# Patient Record
Sex: Female | Born: 1945 | Race: White | Hispanic: No | Marital: Married | Smoking: Never smoker
Health system: Southern US, Community
[De-identification: ages and names within clinical notes are randomized; demographics above are authoritative.]

## PROBLEM LIST (undated history)

## (undated) DIAGNOSIS — F0390 Unspecified dementia without behavioral disturbance: Secondary | ICD-10-CM

## (undated) DIAGNOSIS — E079 Disorder of thyroid, unspecified: Secondary | ICD-10-CM

## (undated) DIAGNOSIS — F329 Major depressive disorder, single episode, unspecified: Secondary | ICD-10-CM

## (undated) DIAGNOSIS — F32A Depression, unspecified: Secondary | ICD-10-CM

## (undated) DIAGNOSIS — I10 Essential (primary) hypertension: Secondary | ICD-10-CM

## (undated) DIAGNOSIS — E119 Type 2 diabetes mellitus without complications: Secondary | ICD-10-CM

---

## 1997-11-04 ENCOUNTER — Other Ambulatory Visit: Admission: RE | Admit: 1997-11-04 | Discharge: 1997-11-04 | Payer: Self-pay | Admitting: *Deleted

## 1998-12-21 ENCOUNTER — Other Ambulatory Visit: Admission: RE | Admit: 1998-12-21 | Discharge: 1998-12-21 | Payer: Self-pay | Admitting: *Deleted

## 2001-01-18 ENCOUNTER — Emergency Department (HOSPITAL_COMMUNITY): Admission: EM | Admit: 2001-01-18 | Discharge: 2001-01-18 | Payer: Self-pay | Admitting: Emergency Medicine

## 2001-01-19 ENCOUNTER — Emergency Department (HOSPITAL_COMMUNITY): Admission: EM | Admit: 2001-01-19 | Discharge: 2001-01-19 | Payer: Self-pay

## 2001-01-21 ENCOUNTER — Ambulatory Visit (HOSPITAL_COMMUNITY): Admission: RE | Admit: 2001-01-21 | Discharge: 2001-01-21 | Payer: Self-pay | Admitting: Emergency Medicine

## 2001-01-21 ENCOUNTER — Encounter: Payer: Self-pay | Admitting: Emergency Medicine

## 2001-01-28 ENCOUNTER — Observation Stay (HOSPITAL_COMMUNITY): Admission: RE | Admit: 2001-01-28 | Discharge: 2001-01-29 | Payer: Self-pay | Admitting: Neurosurgery

## 2001-05-05 ENCOUNTER — Encounter: Payer: Self-pay | Admitting: Vascular Surgery

## 2001-05-06 ENCOUNTER — Inpatient Hospital Stay (HOSPITAL_COMMUNITY): Admission: RE | Admit: 2001-05-06 | Discharge: 2001-05-07 | Payer: Self-pay | Admitting: Vascular Surgery

## 2015-05-03 DIAGNOSIS — D510 Vitamin B12 deficiency anemia due to intrinsic factor deficiency: Secondary | ICD-10-CM | POA: Diagnosis not present

## 2015-05-03 DIAGNOSIS — I739 Peripheral vascular disease, unspecified: Secondary | ICD-10-CM | POA: Diagnosis not present

## 2015-05-03 DIAGNOSIS — J449 Chronic obstructive pulmonary disease, unspecified: Secondary | ICD-10-CM | POA: Diagnosis not present

## 2015-05-03 DIAGNOSIS — G8929 Other chronic pain: Secondary | ICD-10-CM | POA: Diagnosis not present

## 2015-05-03 DIAGNOSIS — M545 Low back pain: Secondary | ICD-10-CM | POA: Diagnosis not present

## 2015-05-03 DIAGNOSIS — E785 Hyperlipidemia, unspecified: Secondary | ICD-10-CM | POA: Diagnosis not present

## 2015-05-03 DIAGNOSIS — M791 Myalgia: Secondary | ICD-10-CM | POA: Diagnosis not present

## 2015-05-03 DIAGNOSIS — F329 Major depressive disorder, single episode, unspecified: Secondary | ICD-10-CM | POA: Diagnosis not present

## 2015-05-03 DIAGNOSIS — R5383 Other fatigue: Secondary | ICD-10-CM | POA: Diagnosis not present

## 2015-05-03 DIAGNOSIS — I2510 Atherosclerotic heart disease of native coronary artery without angina pectoris: Secondary | ICD-10-CM | POA: Diagnosis not present

## 2015-05-03 DIAGNOSIS — E038 Other specified hypothyroidism: Secondary | ICD-10-CM | POA: Diagnosis not present

## 2015-05-03 DIAGNOSIS — M47812 Spondylosis without myelopathy or radiculopathy, cervical region: Secondary | ICD-10-CM | POA: Diagnosis not present

## 2015-05-31 DIAGNOSIS — I2510 Atherosclerotic heart disease of native coronary artery without angina pectoris: Secondary | ICD-10-CM | POA: Diagnosis not present

## 2015-05-31 DIAGNOSIS — E119 Type 2 diabetes mellitus without complications: Secondary | ICD-10-CM | POA: Diagnosis not present

## 2015-05-31 DIAGNOSIS — I10 Essential (primary) hypertension: Secondary | ICD-10-CM | POA: Diagnosis not present

## 2015-05-31 DIAGNOSIS — I739 Peripheral vascular disease, unspecified: Secondary | ICD-10-CM | POA: Diagnosis not present

## 2015-05-31 DIAGNOSIS — F329 Major depressive disorder, single episode, unspecified: Secondary | ICD-10-CM | POA: Diagnosis not present

## 2015-07-05 DIAGNOSIS — S0990XA Unspecified injury of head, initial encounter: Secondary | ICD-10-CM | POA: Diagnosis not present

## 2015-07-05 DIAGNOSIS — S20219A Contusion of unspecified front wall of thorax, initial encounter: Secondary | ICD-10-CM | POA: Diagnosis not present

## 2015-07-05 DIAGNOSIS — M25512 Pain in left shoulder: Secondary | ICD-10-CM | POA: Diagnosis not present

## 2015-07-05 DIAGNOSIS — R079 Chest pain, unspecified: Secondary | ICD-10-CM | POA: Diagnosis not present

## 2015-07-09 DIAGNOSIS — Z79899 Other long term (current) drug therapy: Secondary | ICD-10-CM | POA: Diagnosis not present

## 2015-07-09 DIAGNOSIS — J449 Chronic obstructive pulmonary disease, unspecified: Secondary | ICD-10-CM | POA: Diagnosis not present

## 2015-07-09 DIAGNOSIS — Z79891 Long term (current) use of opiate analgesic: Secondary | ICD-10-CM | POA: Diagnosis not present

## 2015-07-09 DIAGNOSIS — R072 Precordial pain: Secondary | ICD-10-CM | POA: Diagnosis not present

## 2015-07-09 DIAGNOSIS — Z7984 Long term (current) use of oral hypoglycemic drugs: Secondary | ICD-10-CM | POA: Diagnosis not present

## 2015-07-09 DIAGNOSIS — Z87891 Personal history of nicotine dependence: Secondary | ICD-10-CM | POA: Diagnosis not present

## 2015-07-09 DIAGNOSIS — J9811 Atelectasis: Secondary | ICD-10-CM | POA: Diagnosis not present

## 2015-07-09 DIAGNOSIS — F329 Major depressive disorder, single episode, unspecified: Secondary | ICD-10-CM | POA: Diagnosis not present

## 2015-07-09 DIAGNOSIS — Z8249 Family history of ischemic heart disease and other diseases of the circulatory system: Secondary | ICD-10-CM | POA: Diagnosis not present

## 2015-07-09 DIAGNOSIS — R0789 Other chest pain: Secondary | ICD-10-CM | POA: Diagnosis not present

## 2015-07-09 DIAGNOSIS — E039 Hypothyroidism, unspecified: Secondary | ICD-10-CM | POA: Diagnosis not present

## 2015-07-09 DIAGNOSIS — I10 Essential (primary) hypertension: Secondary | ICD-10-CM | POA: Diagnosis not present

## 2015-07-09 DIAGNOSIS — J45909 Unspecified asthma, uncomplicated: Secondary | ICD-10-CM | POA: Diagnosis not present

## 2015-07-09 DIAGNOSIS — Z7982 Long term (current) use of aspirin: Secondary | ICD-10-CM | POA: Diagnosis not present

## 2015-07-09 DIAGNOSIS — E119 Type 2 diabetes mellitus without complications: Secondary | ICD-10-CM | POA: Diagnosis not present

## 2015-07-10 DIAGNOSIS — R072 Precordial pain: Secondary | ICD-10-CM | POA: Diagnosis not present

## 2015-07-10 DIAGNOSIS — R079 Chest pain, unspecified: Secondary | ICD-10-CM | POA: Diagnosis not present

## 2015-07-20 DIAGNOSIS — M940 Chondrocostal junction syndrome [Tietze]: Secondary | ICD-10-CM | POA: Diagnosis not present

## 2015-07-20 DIAGNOSIS — I2510 Atherosclerotic heart disease of native coronary artery without angina pectoris: Secondary | ICD-10-CM | POA: Diagnosis not present

## 2015-08-03 DIAGNOSIS — Z79899 Other long term (current) drug therapy: Secondary | ICD-10-CM | POA: Diagnosis not present

## 2015-08-03 DIAGNOSIS — F329 Major depressive disorder, single episode, unspecified: Secondary | ICD-10-CM | POA: Diagnosis not present

## 2015-08-03 DIAGNOSIS — Z720 Tobacco use: Secondary | ICD-10-CM | POA: Diagnosis not present

## 2015-08-03 DIAGNOSIS — S299XXA Unspecified injury of thorax, initial encounter: Secondary | ICD-10-CM | POA: Diagnosis not present

## 2015-08-03 DIAGNOSIS — R296 Repeated falls: Secondary | ICD-10-CM | POA: Diagnosis not present

## 2015-08-03 DIAGNOSIS — E079 Disorder of thyroid, unspecified: Secondary | ICD-10-CM | POA: Diagnosis not present

## 2015-08-03 DIAGNOSIS — E119 Type 2 diabetes mellitus without complications: Secondary | ICD-10-CM | POA: Diagnosis not present

## 2015-08-03 DIAGNOSIS — Z7984 Long term (current) use of oral hypoglycemic drugs: Secondary | ICD-10-CM | POA: Diagnosis not present

## 2015-08-03 DIAGNOSIS — I10 Essential (primary) hypertension: Secondary | ICD-10-CM | POA: Diagnosis not present

## 2015-08-03 DIAGNOSIS — Z885 Allergy status to narcotic agent status: Secondary | ICD-10-CM | POA: Diagnosis not present

## 2015-08-03 DIAGNOSIS — J449 Chronic obstructive pulmonary disease, unspecified: Secondary | ICD-10-CM | POA: Diagnosis not present

## 2015-08-03 DIAGNOSIS — R0781 Pleurodynia: Secondary | ICD-10-CM | POA: Diagnosis not present

## 2015-08-03 DIAGNOSIS — W19XXXA Unspecified fall, initial encounter: Secondary | ICD-10-CM | POA: Diagnosis not present

## 2015-08-03 DIAGNOSIS — J45909 Unspecified asthma, uncomplicated: Secondary | ICD-10-CM | POA: Diagnosis not present

## 2015-08-03 DIAGNOSIS — Z7982 Long term (current) use of aspirin: Secondary | ICD-10-CM | POA: Diagnosis not present

## 2015-08-03 DIAGNOSIS — Z87891 Personal history of nicotine dependence: Secondary | ICD-10-CM | POA: Diagnosis not present

## 2015-08-03 DIAGNOSIS — Z716 Tobacco abuse counseling: Secondary | ICD-10-CM | POA: Diagnosis not present

## 2015-08-03 DIAGNOSIS — E785 Hyperlipidemia, unspecified: Secondary | ICD-10-CM | POA: Diagnosis not present

## 2015-08-14 DIAGNOSIS — I2510 Atherosclerotic heart disease of native coronary artery without angina pectoris: Secondary | ICD-10-CM | POA: Diagnosis not present

## 2015-08-28 DIAGNOSIS — Z885 Allergy status to narcotic agent status: Secondary | ICD-10-CM | POA: Diagnosis not present

## 2015-08-28 DIAGNOSIS — J449 Chronic obstructive pulmonary disease, unspecified: Secondary | ICD-10-CM | POA: Diagnosis not present

## 2015-08-28 DIAGNOSIS — R296 Repeated falls: Secondary | ICD-10-CM | POA: Diagnosis not present

## 2015-08-28 DIAGNOSIS — F0391 Unspecified dementia with behavioral disturbance: Secondary | ICD-10-CM | POA: Diagnosis not present

## 2015-08-28 DIAGNOSIS — R51 Headache: Secondary | ICD-10-CM | POA: Diagnosis not present

## 2015-08-28 DIAGNOSIS — E785 Hyperlipidemia, unspecified: Secondary | ICD-10-CM | POA: Diagnosis not present

## 2015-08-28 DIAGNOSIS — F419 Anxiety disorder, unspecified: Secondary | ICD-10-CM | POA: Diagnosis not present

## 2015-08-28 DIAGNOSIS — E119 Type 2 diabetes mellitus without complications: Secondary | ICD-10-CM | POA: Diagnosis not present

## 2015-08-28 DIAGNOSIS — E039 Hypothyroidism, unspecified: Secondary | ICD-10-CM | POA: Diagnosis not present

## 2015-08-28 DIAGNOSIS — Z79899 Other long term (current) drug therapy: Secondary | ICD-10-CM | POA: Diagnosis not present

## 2015-08-28 DIAGNOSIS — W0110XA Fall on same level from slipping, tripping and stumbling with subsequent striking against unspecified object, initial encounter: Secondary | ICD-10-CM | POA: Diagnosis not present

## 2015-08-28 DIAGNOSIS — Z7984 Long term (current) use of oral hypoglycemic drugs: Secondary | ICD-10-CM | POA: Diagnosis not present

## 2015-08-28 DIAGNOSIS — I10 Essential (primary) hypertension: Secondary | ICD-10-CM | POA: Diagnosis not present

## 2015-08-28 DIAGNOSIS — F329 Major depressive disorder, single episode, unspecified: Secondary | ICD-10-CM | POA: Diagnosis not present

## 2015-08-28 DIAGNOSIS — Z7982 Long term (current) use of aspirin: Secondary | ICD-10-CM | POA: Diagnosis not present

## 2015-08-28 DIAGNOSIS — M8580 Other specified disorders of bone density and structure, unspecified site: Secondary | ICD-10-CM | POA: Diagnosis not present

## 2015-08-28 DIAGNOSIS — E079 Disorder of thyroid, unspecified: Secondary | ICD-10-CM | POA: Diagnosis not present

## 2015-08-28 DIAGNOSIS — S0990XA Unspecified injury of head, initial encounter: Secondary | ICD-10-CM | POA: Diagnosis not present

## 2015-08-28 DIAGNOSIS — S098XXA Other specified injuries of head, initial encounter: Secondary | ICD-10-CM | POA: Diagnosis not present

## 2015-08-28 DIAGNOSIS — M542 Cervicalgia: Secondary | ICD-10-CM | POA: Diagnosis not present

## 2015-08-28 DIAGNOSIS — B9689 Other specified bacterial agents as the cause of diseases classified elsewhere: Secondary | ICD-10-CM | POA: Diagnosis not present

## 2015-08-28 DIAGNOSIS — H9190 Unspecified hearing loss, unspecified ear: Secondary | ICD-10-CM | POA: Diagnosis not present

## 2015-08-28 DIAGNOSIS — N3000 Acute cystitis without hematuria: Secondary | ICD-10-CM | POA: Diagnosis not present

## 2015-08-28 DIAGNOSIS — Z9181 History of falling: Secondary | ICD-10-CM | POA: Diagnosis not present

## 2015-08-28 DIAGNOSIS — R410 Disorientation, unspecified: Secondary | ICD-10-CM | POA: Diagnosis not present

## 2015-08-28 DIAGNOSIS — Z87891 Personal history of nicotine dependence: Secondary | ICD-10-CM | POA: Diagnosis not present

## 2015-08-29 DIAGNOSIS — N390 Urinary tract infection, site not specified: Secondary | ICD-10-CM | POA: Diagnosis not present

## 2015-08-29 DIAGNOSIS — R296 Repeated falls: Secondary | ICD-10-CM | POA: Diagnosis not present

## 2015-08-29 DIAGNOSIS — S0990XA Unspecified injury of head, initial encounter: Secondary | ICD-10-CM | POA: Diagnosis not present

## 2015-08-29 DIAGNOSIS — M542 Cervicalgia: Secondary | ICD-10-CM | POA: Diagnosis not present

## 2015-08-30 DIAGNOSIS — I739 Peripheral vascular disease, unspecified: Secondary | ICD-10-CM | POA: Diagnosis not present

## 2015-08-30 DIAGNOSIS — R270 Ataxia, unspecified: Secondary | ICD-10-CM | POA: Diagnosis not present

## 2015-08-30 DIAGNOSIS — R296 Repeated falls: Secondary | ICD-10-CM | POA: Diagnosis not present

## 2015-08-30 DIAGNOSIS — F0390 Unspecified dementia without behavioral disturbance: Secondary | ICD-10-CM | POA: Diagnosis not present

## 2015-08-30 DIAGNOSIS — R4182 Altered mental status, unspecified: Secondary | ICD-10-CM | POA: Diagnosis not present

## 2015-08-30 DIAGNOSIS — N3000 Acute cystitis without hematuria: Secondary | ICD-10-CM | POA: Diagnosis not present

## 2015-08-31 DIAGNOSIS — R296 Repeated falls: Secondary | ICD-10-CM | POA: Diagnosis not present

## 2015-08-31 DIAGNOSIS — G459 Transient cerebral ischemic attack, unspecified: Secondary | ICD-10-CM | POA: Diagnosis not present

## 2015-08-31 DIAGNOSIS — R4182 Altered mental status, unspecified: Secondary | ICD-10-CM | POA: Diagnosis not present

## 2015-08-31 DIAGNOSIS — I7389 Other specified peripheral vascular diseases: Secondary | ICD-10-CM | POA: Diagnosis not present

## 2015-08-31 DIAGNOSIS — F0390 Unspecified dementia without behavioral disturbance: Secondary | ICD-10-CM | POA: Diagnosis not present

## 2015-08-31 DIAGNOSIS — N390 Urinary tract infection, site not specified: Secondary | ICD-10-CM | POA: Diagnosis not present

## 2015-09-01 DIAGNOSIS — R4182 Altered mental status, unspecified: Secondary | ICD-10-CM | POA: Diagnosis not present

## 2015-09-01 DIAGNOSIS — R296 Repeated falls: Secondary | ICD-10-CM | POA: Diagnosis not present

## 2015-09-01 DIAGNOSIS — F0390 Unspecified dementia without behavioral disturbance: Secondary | ICD-10-CM | POA: Diagnosis not present

## 2015-09-01 DIAGNOSIS — N390 Urinary tract infection, site not specified: Secondary | ICD-10-CM | POA: Diagnosis not present

## 2015-09-02 DIAGNOSIS — R4182 Altered mental status, unspecified: Secondary | ICD-10-CM | POA: Diagnosis not present

## 2015-09-02 DIAGNOSIS — F0390 Unspecified dementia without behavioral disturbance: Secondary | ICD-10-CM | POA: Diagnosis not present

## 2015-09-02 DIAGNOSIS — R296 Repeated falls: Secondary | ICD-10-CM | POA: Diagnosis not present

## 2015-09-02 DIAGNOSIS — N390 Urinary tract infection, site not specified: Secondary | ICD-10-CM | POA: Diagnosis not present

## 2015-09-04 ENCOUNTER — Inpatient Hospital Stay (HOSPITAL_COMMUNITY)
Admission: EM | Admit: 2015-09-04 | Discharge: 2015-09-12 | DRG: 918 | Disposition: A | Discharge status: Skilled Nursing Facility | Payer: Medicare Other | Attending: Family Medicine | Admitting: Family Medicine

## 2015-09-04 ENCOUNTER — Encounter (HOSPITAL_COMMUNITY): Payer: Self-pay

## 2015-09-04 ENCOUNTER — Emergency Department (HOSPITAL_COMMUNITY): Payer: Medicare Other

## 2015-09-04 DIAGNOSIS — D72829 Elevated white blood cell count, unspecified: Secondary | ICD-10-CM | POA: Insufficient documentation

## 2015-09-04 DIAGNOSIS — R410 Disorientation, unspecified: Secondary | ICD-10-CM | POA: Diagnosis not present

## 2015-09-04 DIAGNOSIS — R4182 Altered mental status, unspecified: Secondary | ICD-10-CM | POA: Diagnosis not present

## 2015-09-04 DIAGNOSIS — T381X1A Poisoning by thyroid hormones and substitutes, accidental (unintentional), initial encounter: Principal | ICD-10-CM | POA: Diagnosis present

## 2015-09-04 DIAGNOSIS — R440 Auditory hallucinations: Secondary | ICD-10-CM | POA: Diagnosis present

## 2015-09-04 DIAGNOSIS — F1290 Cannabis use, unspecified, uncomplicated: Secondary | ICD-10-CM | POA: Diagnosis present

## 2015-09-04 DIAGNOSIS — E039 Hypothyroidism, unspecified: Secondary | ICD-10-CM | POA: Diagnosis present

## 2015-09-04 DIAGNOSIS — Z7982 Long term (current) use of aspirin: Secondary | ICD-10-CM

## 2015-09-04 DIAGNOSIS — G9340 Encephalopathy, unspecified: Secondary | ICD-10-CM

## 2015-09-04 DIAGNOSIS — E119 Type 2 diabetes mellitus without complications: Secondary | ICD-10-CM | POA: Diagnosis present

## 2015-09-04 DIAGNOSIS — I10 Essential (primary) hypertension: Secondary | ICD-10-CM | POA: Diagnosis present

## 2015-09-04 DIAGNOSIS — F419 Anxiety disorder, unspecified: Secondary | ICD-10-CM | POA: Diagnosis present

## 2015-09-04 DIAGNOSIS — Z7984 Long term (current) use of oral hypoglycemic drugs: Secondary | ICD-10-CM

## 2015-09-04 DIAGNOSIS — F0390 Unspecified dementia without behavioral disturbance: Secondary | ICD-10-CM

## 2015-09-04 DIAGNOSIS — F0391 Unspecified dementia with behavioral disturbance: Secondary | ICD-10-CM | POA: Diagnosis present

## 2015-09-04 DIAGNOSIS — F329 Major depressive disorder, single episode, unspecified: Secondary | ICD-10-CM | POA: Diagnosis present

## 2015-09-04 DIAGNOSIS — R1084 Generalized abdominal pain: Secondary | ICD-10-CM | POA: Insufficient documentation

## 2015-09-04 DIAGNOSIS — Z79899 Other long term (current) drug therapy: Secondary | ICD-10-CM

## 2015-09-04 DIAGNOSIS — E0590 Thyrotoxicosis, unspecified without thyrotoxic crisis or storm: Secondary | ICD-10-CM

## 2015-09-04 DIAGNOSIS — F05 Delirium due to known physiological condition: Secondary | ICD-10-CM | POA: Diagnosis present

## 2015-09-04 HISTORY — DX: Disorder of thyroid, unspecified: E07.9

## 2015-09-04 HISTORY — DX: Unspecified dementia, unspecified severity, without behavioral disturbance, psychotic disturbance, mood disturbance, and anxiety: F03.90

## 2015-09-04 HISTORY — DX: Essential (primary) hypertension: I10

## 2015-09-04 HISTORY — DX: Depression, unspecified: F32.A

## 2015-09-04 HISTORY — DX: Major depressive disorder, single episode, unspecified: F32.9

## 2015-09-04 HISTORY — DX: Type 2 diabetes mellitus without complications: E11.9

## 2015-09-04 LAB — ETHANOL: Alcohol, Ethyl (B): <5 mg/dL (ref <5)

## 2015-09-04 LAB — ACETAMINOPHEN LEVEL

## 2015-09-04 LAB — SALICYLATE LEVEL: Salicylate Lvl: <4 mg/dL (ref 2.8–30.0)

## 2015-09-04 NOTE — ED Provider Notes (Signed)
CSN: [id number]     Arrival date & time 09/04/15  2105 History  By signing my name below, I, Tamara Shaw, attest that this documentation has been prepared under the direction and in the presence of Alberteen Sam, MD. Electronically signed, Tamara Shaw, ED Scribe. 09/05/2015. 12:24 AM.     Chief Complaint  Patient presents with  . Manic Behavior    The history is provided by a relative. No language interpreter was used.   HPI Comments: LEVEL 5 CAVEAT DUE TO ALTERED MENTAL STATUS Tamara Shaw is a 70 y.o. female, with a PMHx of dementia, DM, HTN, brought in by a relative, who presents to the Emergency Department complaining of gradually worsening altered mental status for the past four months. Pt's son reports "the lights are on but nobody is home". Pt's son states that the pt does not know where she is at sometimes. Pt's son also states that the pt has not had any sleep in the past two days. Pt had an imaging during a recent hospital admission but son states that nothing abnormal was found. Pt's son reports that the pt will sometimes "talk non-stop" Pt's son also states he has noticed that the pt has had difficulty ambulating recently; states she has been stumbling and occasionally falls due to this. Pt reports multiple episodes of the pt falling in the past 3 months, 1 episode she was evaluated in the ED and was Dx with a concussion. Pt's son also reports auditory hallucinations that have begun recently. When the pt is asked if she knows where she is; she responds: "i forgot". When asked her first and last name, she knew her first name but did not know he last name. Pt states she does not know what month it is. When asked to spell "world" she responded "w-a". Pt was asked to repeat the phrase "no ifs ands or buts" and was unable to do so.  It is unclear whether patient has a history of dementia. Per the son she has episodes of clearing between episodes of delirium. She at times can do her  ADLs and drives independently but then will become "out of her mind."  Past Medical History  Diagnosis Date  . Diabetes mellitus without complication (HCC)   . Hypertension   . Depression   . Thyroid disease     hypothyroid  . Dementia    History reviewed. No pertinent past surgical history. History reviewed. No pertinent family history. Social History  Substance Use Topics  . Smoking status: Never Smoker   . Smokeless tobacco: None  . Alcohol Use: No   OB History    No data available     Review of Systems  Unable to perform ROS: Mental status change  All other systems reviewed and are negative.     Allergies  Review of patient's allergies indicates no known allergies.  Home Medications   Prior to Admission medications   Medication Sig Start Date End Date Taking? Authorizing Provider  albuterol (PROVENTIL HFA;VENTOLIN HFA) 108 (90 Base) MCG/ACT inhaler Inhale 1-2 puffs into the lungs every 4 (four) hours as needed for wheezing or shortness of breath.   Yes Historical Provider, MD  ALPRAZolam (XANAX) 0.25 MG tablet Take 0.25 mg by mouth 2 (two) times daily as needed for anxiety or sleep.   Yes Historical Provider, MD  carvedilol (COREG) 6.25 MG tablet Take 6.25 mg by mouth 2 (two) times daily with a meal.   Yes Historical Provider, MD  FLUoxetine (PROZAC) 20 MG capsule Take 20 mg by mouth daily.   Yes Historical Provider, MD  levothyroxine (SYNTHROID, LEVOTHROID) 75 MCG tablet Take 75 mcg by mouth daily before breakfast.   Yes Historical Provider, MD  lisinopril (PRINIVIL,ZESTRIL) 5 MG tablet Take 5 mg by mouth daily.   Yes Historical Provider, MD  metFORMIN (GLUCOPHAGE) 1000 MG tablet Take 500 mg by mouth 2 (two) times daily with a meal.   Yes Historical Provider, MD  pravastatin (PRAVACHOL) 80 MG tablet Take 80 mg by mouth daily.   Yes Historical Provider, MD  QUEtiapine (SEROQUEL) 25 MG tablet Take 25 mg by mouth at bedtime.   Yes Historical Provider, MD  vitamin B-12  (CYANOCOBALAMIN) 1000 MCG tablet Take 1,000 mcg by mouth daily.   Yes Historical Provider, MD  aspirin EC 81 MG tablet Take 81 mg by mouth daily.    Historical Provider, MD   BP 156/69 mmHg  Pulse 76  Temp(Src) 98.6 F (37 C) (Oral)  Resp 17  SpO2 96% Physical Exam  Constitutional:  Disheveled appearing  HENT:  Head: Normocephalic and atraumatic.  Cardiovascular: Normal rate, regular rhythm and normal heart sounds.   No murmur heard. Pulmonary/Chest: Effort normal and breath sounds normal. No respiratory distress. She has no wheezes.  Abdominal: Soft. Bowel sounds are normal. There is no tenderness.  Neurological: She is alert.  Orientation difficult to assess, alert, no facial droop noted, patient has difficulty following complex commands, mild tremor noted, difficulty with attention tasks  Skin: Skin is warm and dry.  Nursing note and vitals reviewed.   ED Course  Procedures  DIAGNOSTIC STUDIES: Oxygen Saturation is 96% on RA, normal by my interpretation.  COORDINATION OF CARE: 11:22 PM-Will order blood work and CT head. Discussed treatment plan with pt at bedside and pt agreed to plan.   Labs Review Labs Reviewed  ACETAMINOPHEN LEVEL - Abnormal; Notable for the following:    Acetaminophen (Tylenol), Serum <10 (*)    All other components within normal limits  URINE RAPID DRUG SCREEN, HOSP PERFORMED - Abnormal; Notable for the following:    Benzodiazepines POSITIVE (*)    All other components within normal limits  URINALYSIS, ROUTINE W REFLEX MICROSCOPIC (NOT AT [facility]) - Abnormal; Notable for the following:    Hgb urine dipstick TRACE (*)    All other components within normal limits  URINE MICROSCOPIC-ADD ON - Abnormal; Notable for the following:    Squamous Epithelial / LPF 0-5 (*)    Bacteria, UA RARE (*)    Crystals URIC ACID CRYSTALS (*)    All other components within normal limits  CBC WITH DIFFERENTIAL/PLATELET - Abnormal; Notable for the following:    WBC 12.0  (*)    Monocytes Absolute 1.2 (*)    All other components within normal limits  COMPREHENSIVE METABOLIC PANEL - Abnormal; Notable for the following:    Glucose, Bld 163 (*)    BUN 28 (*)    All other components within normal limits  ETHANOL  SALICYLATE LEVEL    Imaging Review Ct Head Wo Contrast  09/05/2015  CLINICAL DATA:  Fall with altered mental status. Initial encounter. EXAM: CT HEAD WITHOUT CONTRAST TECHNIQUE: Contiguous axial images were obtained from the base of the skull through the vertex without intravenous contrast. COMPARISON:  None. FINDINGS: Brain: No evidence of acute infarction, hemorrhage, hydrocephalus, or mass lesion/mass effect. Mild patchy microvascular ischemic change in the cerebral white matter. Normal cerebral volume for age. Vascular: Atherosclerotic calcification. Skull: Negative  for fracture or focal lesion. Sinuses/Orbits: Bilateral cataract resection. Notable large leftward nasal septal spur with inferior turbinate deformation. Other: None. IMPRESSION: 1. No acute finding. 2. Mild chronic white matter disease. Electronically Signed   By: Marnee Spring M.D.   On: 09/05/2015 00:18   I have personally reviewed and evaluated these images and lab results as part of my medical decision-making.   EKG Interpretation   Date/Time:  Tuesday September 05 2015 03:29:08 EDT Ventricular Rate:  85 PR Interval:    QRS Duration: 93 QT Interval:  390 QTC Calculation: 464 R Axis:   97 Text Interpretation:  Sinus rhythm Short PR interval Right axis deviation  Confirmed by Kent Riendeau  MD, Arham Symmonds ([id number]) on 09/05/2015 3:39:36 AM      MDM   Final diagnoses:  Delirium    Patient presents with waxing and waning alterations of consciousness and mental status changes. She appears acutely delirious. Vital signs are reassuring. She has difficulty with attention task. Is unclear what her prior workup has been as I am unable to view outside records. She was encouraged follow-up with a  neurologist. Basic labwork obtained. No metabolic derangements. UDS positive for benzos. Urinalysis without urinary tract infection.  She continues to be delirious in the ER. Son denies any history of dementia and reports that baseline before several months ago she did live independently and drive as well as do her activities of daily living. Discussed with neurology. Patient will need MRI and EEG. Will admit for further workup. Patient may also need Geri-psychiatric eval.  I personally performed the services described in this documentation, which was scribed in my presence. The recorded information has been reviewed and is accurate.     Shon Baton, MD 09/05/15 [phone]

## 2015-09-04 NOTE — ED Notes (Signed)
Patient bib mobile crisis unit with family.  Representative that patient has had manic behavior and has not been sleeping.  Per family patient was inpatient on July 11 and discharged on July 15 and advised to follow up with nuerolology.  Patient here tonight due to continuous manic behavior and not sleeping.

## 2015-09-05 ENCOUNTER — Encounter (HOSPITAL_COMMUNITY): Payer: Self-pay | Admitting: Family Medicine

## 2015-09-05 ENCOUNTER — Observation Stay (HOSPITAL_COMMUNITY): Payer: Medicare Other

## 2015-09-05 DIAGNOSIS — E039 Hypothyroidism, unspecified: Secondary | ICD-10-CM | POA: Diagnosis not present

## 2015-09-05 DIAGNOSIS — R4182 Altered mental status, unspecified: Secondary | ICD-10-CM | POA: Diagnosis not present

## 2015-09-05 DIAGNOSIS — I10 Essential (primary) hypertension: Secondary | ICD-10-CM | POA: Diagnosis present

## 2015-09-05 DIAGNOSIS — E119 Type 2 diabetes mellitus without complications: Secondary | ICD-10-CM

## 2015-09-05 DIAGNOSIS — D72829 Elevated white blood cell count, unspecified: Secondary | ICD-10-CM | POA: Diagnosis present

## 2015-09-05 DIAGNOSIS — R410 Disorientation, unspecified: Secondary | ICD-10-CM | POA: Diagnosis not present

## 2015-09-05 LAB — COMPREHENSIVE METABOLIC PANEL
ALT: 15 U/L (ref 14–54)
AST: 19 U/L (ref 15–41)
Albumin: 4.1 g/dL (ref 3.5–5.0)
Alkaline Phosphatase: 82 U/L (ref 38–126)
Anion gap: 7 (ref 5–15)
BUN: 28 mg/dL — AB (ref 6–20)
CHLORIDE: 109 mmol/L (ref 101–111)
CO2: 26 mmol/L (ref 22–32)
Calcium: 9.8 mg/dL (ref 8.9–10.3)
Creatinine, Ser: 0.87 mg/dL (ref 0.44–1.00)
GFR calc non Af Amer: >60 mL/min (ref >60)
GLUCOSE: 163 mg/dL — AB (ref 65–99)
POTASSIUM: 3.8 mmol/L (ref 3.5–5.1)
SODIUM: 142 mmol/L (ref 135–145)
Total Bilirubin: 0.6 mg/dL (ref 0.3–1.2)
Total Protein: 7.4 g/dL (ref 6.5–8.1)

## 2015-09-05 LAB — GLUCOSE, CAPILLARY
GLUCOSE-CAPILLARY: 193 mg/dL — AB (ref 65–99)
GLUCOSE-CAPILLARY: 157 mg/dL — AB (ref 65–99)
Glucose-Capillary: 128 mg/dL — ABNORMAL HIGH (ref 65–99)
Glucose-Capillary: 188 mg/dL — ABNORMAL HIGH (ref 65–99)

## 2015-09-05 LAB — CBC WITH DIFFERENTIAL/PLATELET
Basophils Absolute: 0 10*3/uL (ref 0.0–0.1)
Basophils Relative: 0 %
EOS ABS: 0.2 10*3/uL (ref 0.0–0.7)
EOS PCT: 2 %
HCT: 39 % (ref 36.0–46.0)
Hemoglobin: 13 g/dL (ref 12.0–15.0)
LYMPHS ABS: 3.7 10*3/uL (ref 0.7–4.0)
Lymphocytes Relative: 31 %
MCH: 31.2 pg (ref 26.0–34.0)
MCHC: 33.3 g/dL (ref 30.0–36.0)
MCV: 93.5 fL (ref 78.0–100.0)
MONOS PCT: 10 %
Monocytes Absolute: 1.2 10*3/uL — ABNORMAL HIGH (ref 0.1–1.0)
Neutro Abs: 6.9 10*3/uL (ref 1.7–7.7)
Neutrophils Relative %: 57 %
PLATELETS: 275 10*3/uL (ref 150–400)
RBC: 4.17 MIL/uL (ref 3.87–5.11)
RDW: 13.5 % (ref 11.5–15.5)
WBC: 12 10*3/uL — ABNORMAL HIGH (ref 4.0–10.5)

## 2015-09-05 LAB — RAPID URINE DRUG SCREEN, HOSP PERFORMED
Amphetamines: NOT DETECTED
Barbiturates: NOT DETECTED
Benzodiazepines: POSITIVE — AB
Cocaine: NOT DETECTED
OPIATES: NOT DETECTED
TETRAHYDROCANNABINOL: NOT DETECTED

## 2015-09-05 LAB — URINALYSIS, ROUTINE W REFLEX MICROSCOPIC
Bilirubin Urine: NEGATIVE
Glucose, UA: NEGATIVE mg/dL
KETONES UR: NEGATIVE mg/dL
LEUKOCYTES UA: NEGATIVE
NITRITE: NEGATIVE
PH: 5 (ref 5.0–8.0)
PROTEIN: NEGATIVE mg/dL
Specific Gravity, Urine: 1.024 (ref 1.005–1.030)

## 2015-09-05 LAB — URINE MICROSCOPIC-ADD ON: WBC UA: NONE SEEN WBC/hpf (ref 0–5)

## 2015-09-05 LAB — VITAMIN B12: Vitamin B-12: 1018 pg/mL — ABNORMAL HIGH (ref 180–914)

## 2015-09-05 MED ORDER — CARVEDILOL 6.25 MG PO TABS
6.2500 mg | ORAL_TABLET | Freq: Two times a day (BID) | ORAL | Status: DC
  Administered 2015-09-05 – 2015-09-12 (×15): 6.25 mg via ORAL
  Filled 2015-09-05 (×17): qty 1

## 2015-09-05 MED ORDER — ASPIRIN EC 81 MG PO TBEC
81.0000 mg | DELAYED_RELEASE_TABLET | Freq: Every day | ORAL | Status: DC
  Administered 2015-09-05 – 2015-09-12 (×8): 81 mg via ORAL
  Filled 2015-09-05 (×8): qty 1

## 2015-09-05 MED ORDER — ACETAMINOPHEN 650 MG RE SUPP: 650.0000 mg | Freq: Four times a day (QID) | RECTAL | Status: DC | PRN

## 2015-09-05 MED ORDER — QUETIAPINE FUMARATE 25 MG PO TABS
25.0000 mg | ORAL_TABLET | Freq: Three times a day (TID) | ORAL | Status: DC
  Administered 2015-09-05 – 2015-09-12 (×21): 25 mg via ORAL
  Filled 2015-09-05 (×22): qty 1

## 2015-09-05 MED ORDER — INSULIN ASPART 100 UNIT/ML ~~LOC~~ SOLN
0.0000 [IU] | Freq: Three times a day (TID) | SUBCUTANEOUS | Status: DC
  Administered 2015-09-05 (×2): 2 [IU] via SUBCUTANEOUS
  Administered 2015-09-05: 1 [IU] via SUBCUTANEOUS
  Administered 2015-09-06 – 2015-09-07 (×4): 3 [IU] via SUBCUTANEOUS
  Administered 2015-09-07 – 2015-09-08 (×3): 2 [IU] via SUBCUTANEOUS
  Administered 2015-09-08: 5 [IU] via SUBCUTANEOUS
  Administered 2015-09-08: 2 [IU] via SUBCUTANEOUS
  Administered 2015-09-09: 1 [IU] via SUBCUTANEOUS
  Administered 2015-09-09: 3 [IU] via SUBCUTANEOUS
  Administered 2015-09-09 – 2015-09-10 (×3): 2 [IU] via SUBCUTANEOUS
  Administered 2015-09-10 – 2015-09-11 (×2): 1 [IU] via SUBCUTANEOUS
  Administered 2015-09-11: 3 [IU] via SUBCUTANEOUS
  Administered 2015-09-11: 2 [IU] via SUBCUTANEOUS
  Administered 2015-09-12: 3 [IU] via SUBCUTANEOUS

## 2015-09-05 MED ORDER — LEVOTHYROXINE SODIUM 75 MCG PO TABS
75.0000 ug | ORAL_TABLET | Freq: Every day | ORAL | Status: DC
  Administered 2015-09-05 – 2015-09-07 (×3): 75 ug via ORAL
  Filled 2015-09-05 (×3): qty 1

## 2015-09-05 MED ORDER — LISINOPRIL 5 MG PO TABS
5.0000 mg | ORAL_TABLET | Freq: Every day | ORAL | Status: DC
  Administered 2015-09-05 – 2015-09-12 (×8): 5 mg via ORAL
  Filled 2015-09-05 (×8): qty 1

## 2015-09-05 MED ORDER — ACETAMINOPHEN 325 MG PO TABS
650.0000 mg | ORAL_TABLET | Freq: Four times a day (QID) | ORAL | Status: DC | PRN
  Administered 2015-09-05 – 2015-09-11 (×3): 650 mg via ORAL
  Filled 2015-09-05 (×3): qty 2

## 2015-09-05 MED ORDER — QUETIAPINE FUMARATE 25 MG PO TABS
25.0000 mg | ORAL_TABLET | Freq: Three times a day (TID) | ORAL | Status: DC | PRN
  Administered 2015-09-08 – 2015-09-12 (×2): 25 mg via ORAL
  Filled 2015-09-05 (×3): qty 1

## 2015-09-05 MED ORDER — PRAVASTATIN SODIUM 80 MG PO TABS
80.0000 mg | ORAL_TABLET | Freq: Every day | ORAL | Status: DC
  Administered 2015-09-05 – 2015-09-12 (×8): 80 mg via ORAL
  Filled 2015-09-05 (×8): qty 1

## 2015-09-05 MED ORDER — FLUOXETINE HCL 20 MG PO CAPS
20.0000 mg | ORAL_CAPSULE | Freq: Every day | ORAL | Status: DC
  Administered 2015-09-05 – 2015-09-12 (×8): 20 mg via ORAL
  Filled 2015-09-05 (×8): qty 1

## 2015-09-05 MED ORDER — HALOPERIDOL LACTATE 5 MG/ML IJ SOLN
2.0000 mg | Freq: Once | INTRAMUSCULAR | Status: AC
  Administered 2015-09-05: 2 mg via INTRAVENOUS
  Filled 2015-09-05: qty 1

## 2015-09-05 MED ORDER — ENOXAPARIN SODIUM 40 MG/0.4ML ~~LOC~~ SOLN
40.0000 mg | SUBCUTANEOUS | Status: DC
  Administered 2015-09-05 – 2015-09-11 (×7): 40 mg via SUBCUTANEOUS
  Filled 2015-09-05 (×8): qty 0.4

## 2015-09-05 MED ORDER — INSULIN ASPART 100 UNIT/ML ~~LOC~~ SOLN
0.0000 [IU] | Freq: Every day | SUBCUTANEOUS | Status: DC
  Administered 2015-09-07 – 2015-09-10 (×3): 2 [IU] via SUBCUTANEOUS

## 2015-09-05 MED ORDER — SODIUM CHLORIDE 0.9 % IV BOLUS (SEPSIS)
500.0000 mL | Freq: Once | INTRAVENOUS | Status: AC
  Administered 2015-09-05: 500 mL via INTRAVENOUS

## 2015-09-05 MED ORDER — ONDANSETRON HCL 4 MG/2ML IJ SOLN: 4.0000 mg | Freq: Four times a day (QID) | INTRAMUSCULAR | Status: DC | PRN

## 2015-09-05 MED ORDER — ONDANSETRON HCL 4 MG PO TABS: 4.0000 mg | ORAL_TABLET | Freq: Four times a day (QID) | ORAL | Status: DC | PRN
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
## 2015-09-05 NOTE — Progress Notes (Signed)
Patient admitted after midnight.  Please see H&P.  Neuro eval in process.  Unable to do MRI. Psych consult placed at recommendation of neuro PA.  Marlin CanaryJessica Jencarlo Bonadonna DO

## 2015-09-05 NOTE — ED Notes (Signed)
Tamara FiguresDonald Shaw (son) [phone].

## 2015-09-05 NOTE — ED Notes (Signed)
Pt continues to be restless.  

## 2015-09-05 NOTE — ED Notes (Signed)
Pt calm at this time.

## 2015-09-05 NOTE — ED Notes (Signed)
Pt did not follow instruction to urinate in hat.  Pt voided directly into commode.

## 2015-09-05 NOTE — ED Notes (Signed)
Pt remains disoriented and delirious

## 2015-09-05 NOTE — ED Notes (Signed)
Report given to [facility]. 

## 2015-09-05 NOTE — ED Notes (Signed)
Pt removed all her clothes and teeth. Pt teeth were in her bed. This nurse redressed the Pt and stored her dentures in a denture cup. Pt is speaking claiming there are children in the room.

## 2015-09-05 NOTE — Care Management Obs Status (Signed)
MEDICARE OBSERVATION STATUS NOTIFICATION   Patient Details  Name: Tamara LollingDeanna P Shaw MRN: [id number] Date of Birth: Apr 16, 1945   Medicare Observation Status Notification Given:  Yes  Pt confused and agitated , notification left for family with number to call CM for questions.   Camya Haydon, Annamarie Majorheryl U, RN 09/05/2015, 1:00 PM
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
## 2015-09-05 NOTE — Progress Notes (Signed)
EEG Completed; Results Pending  

## 2015-09-05 NOTE — ED Notes (Signed)
Hospitalist at bedside 

## 2015-09-05 NOTE — ED Notes (Signed)
[facility] arrived  

## 2015-09-05 NOTE — Procedures (Signed)
ELECTROENCEPHALOGRAM REPORT  Date of Study: 09/05/2015  Patient's Name: Derrell LollingDeanna P Shores MRN: [id number] Date of Birth: Sep 23, 1945  Referring Provider: Ross Marcusourtney Horton, MD  Indication: 70 year old woman with altered mental status  Medications: acetaminophen (TYLENOL) suppository 650 mg  L1 acetaminophen (TYLENOL) tablet 650 mg  aspirin EC tablet 81 mg  carvedilol (COREG) tablet 6.25 mg  enoxaparin (LOVENOX) injection 40 mg  FLUoxetine (PROZAC) capsule 20 mg  insulin aspart (novoLOG) injection 0-5 Units  insulin aspart (novoLOG) injection 0-9 Units  levothyroxine (SYNTHROID, LEVOTHROID) tablet 75 mcg  lisinopril (PRINIVIL,ZESTRIL) tablet 5 mg L2 ondansetron (ZOFRAN) injection 4 mg L2 ondansetron (ZOFRAN) tablet 4 mg  pravastatin (PRAVACHOL) tablet 80 mg  Technical Summary: This is a multichannel digital EEG recording, using the international 10-20 placement system with electrodes applied with paste and impedances below 5000 ohms.    Description: The EEG background is symmetric with generalized slowing, consisting of 2-3 Hz delta and 5-6 Hz theta activity.  The EEG is reactive to eye opening and closing.  Diffuse beta activity is seen, with a bilateral frontal preponderance.  No focal or generalized abnormalities are seen.  No focal or generalized epileptiform discharges are seen.  Stage II sleep is not seen.  Hyperventilation and photic stimulation were not performed.  ECG revealed normal cardiac rate and rhythm.  Impression: This is an abnormal EEG due to generalized background slowing, indicative of diffuse cerebral dysfunction.  This is a nonspecific finding which may be due to toxic-metabolic etiology or other diffuse physiologic abnormality.  Tabytha Gradillas R. Everlena CooperJaffe, DO

## 2015-09-05 NOTE — Progress Notes (Signed)
[facility] Alert and oriented to person and place. Follow commands. Respond to question intermittently. Call light within reach.

## 2015-09-05 NOTE — H&P (Signed)
History and Physical  Patient Name: Tamara Shaw     ZOX:[id number]    DOB: 11/18/1945    DOA: 09/04/2015 PCP: Bjorn Loser, DO   Patient coming from: Home  Chief Complaint: Confusion  HPI: Tamara Shaw is a 70 y.o. female with a past medical history significant for NIDDM, HTN, and hypothyroidism who presents with delirium.  All history collected from chart and ED MD as patient is unaccompanied and unable to provide her own history given her mental status.  Per chart, she lived near [place] alone until a few months ago, driving and managing all ADLs and IADLs independently.  During the last 3-4 months, she had had several falls (was evaluated once in the ER for "concussion") but was increasingly unsafe at home alone, and either family were staying with her or she had gone to stay with family until one week ago when she was admitted to [facility] by [facility] for four days for delirium.  These records are not currently available, but son believes she may have had a UTI, may have had normal brain imaging.  Discharge instructions/paperwork from that hospitalization is available and shows she was started on quetiapine 25 mg BID for delirium in the setting of dementia and her alprazolam was lowered (she also was stopped on temazepam, glimepiride and HCTZ during that hospitalization).    She was discharged 4 days ago and came to [facility] to stay with her son with plans to follow up with Neurology as outpatient, but he reported that she has been confused since then, with waxing and waning hyperactivity, hallucinations, confusion and disorientation.  Tonight, he found her hallucinating, naked in the living room, so he brought her to the ER.  ED course: -Afebrile, heart rate and respirations normal, mildly hypertensive, saturating well on room air -Na 142, K 3.8, Cr 0.9 (baseline unknown), WBC 12K, Hgb 13 -Alcohol negative -Urinalysis without pyuria or hematuria -UDS with  benzodiazepines (prescribed) -CT head unremarkable -Case was discussed with neurology who recommended EEG and MRI and neuro consult, likely GeriPsych consult if indeed she has dementia     ROS: Unable to obtain due to patient mentation.    Past Medical History  Diagnosis Date  . Diabetes mellitus without complication (HCC)   . Hypertension   . Depression   . Thyroid disease     hypothyroid  . Dementia     History reviewed. No pertinent past surgical history.  Social History: Patient lives currently with her son, prior to this week was living near [place] alone.  The patient walks unassisted.  Non-smoker.    No Known Allergies  Family history: Unable to obtain due to patient mentation.     Prior to Admission medications   Medication Sig Start Date End Date Taking? Authorizing Provider  albuterol (PROVENTIL HFA;VENTOLIN HFA) 108 (90 Base) MCG/ACT inhaler Inhale 1-2 puffs into the lungs every 4 (four) hours as needed for wheezing or shortness of breath.   Yes Historical Provider, MD  ALPRAZolam (XANAX) 0.25 MG tablet Take 0.25 mg by mouth 2 (two) times daily as needed for anxiety or sleep.   Yes Historical Provider, MD  carvedilol (COREG) 6.25 MG tablet Take 6.25 mg by mouth 2 (two) times daily with a meal.   Yes Historical Provider, MD  FLUoxetine (PROZAC) 20 MG capsule Take 20 mg by mouth daily.   Yes Historical Provider, MD  levothyroxine (SYNTHROID, LEVOTHROID) 75 MCG tablet Take 75 mcg by mouth daily before breakfast.  Yes Historical Provider, MD  lisinopril (PRINIVIL,ZESTRIL) 5 MG tablet Take 5 mg by mouth daily.   Yes Historical Provider, MD  metFORMIN (GLUCOPHAGE) 1000 MG tablet Take 500 mg by mouth 2 (two) times daily with a meal.   Yes Historical Provider, MD  pravastatin (PRAVACHOL) 80 MG tablet Take 80 mg by mouth daily.   Yes Historical Provider, MD  QUEtiapine (SEROQUEL) 25 MG tablet Take 25 mg by mouth at bedtime.   Yes Historical Provider, MD  vitamin B-12  (CYANOCOBALAMIN) 1000 MCG tablet Take 1,000 mcg by mouth daily.   Yes Historical Provider, MD  aspirin EC 81 MG tablet Take 81 mg by mouth daily.    Historical Provider, MD       Physical Exam: BP 156/69 mmHg  Pulse 76  Temp(Src) 98.6 F (37 C) (Oral)  Resp 17  SpO2 96% General appearance: Well-developed, female, awake and in no obvious distress.   Eyes: Conjunctiva normal, lids and lashes normal.   PERRL.  ENT: No nasal deformity, discharge.  OP moist without lesions.  Paritally edentulous.   Lymph: No cervical or supraclavicular lymphadenopathy. Skin: Warm and dry.  No suspicious rashes or lesions.  Mild abrasion on right shin. Cardiac: RRR, nl S1-S2, no murmurs appreciated.  Capillary refill is brisk.  JVP normal.  No LE edema.  Radial and DP pulses 2+ and symmetric. Respiratory: Normal respiratory rate and rhythm.  CTAB without rales or wheezes. GI: Abdomen soft without rigidity.  No TTP. No ascites, distension, hepatosplenomegaly.  No masses that I appreciate. MSK: No deformities or effusions.  No clubbing/cyanosis. Neuro: Pupils are 3 mm and reactive to 2 mm.  Extraocular movements are intact, without nystagmus.  Cranial nerve 5 is within normal limits.  Cranial nerve 7 is symmetrical.  Cranial nerve 8 is within normal limits.  Cranial nerves 9 and 10 reveal equal palate elevation.  Cranial nerve 11 reveals sternocleidomastoid strong.  Cranial nerve 12 is midline.  Motor strength testing is 5/5 in the upper and lower extremities bilaterally with normal motor, tone and bulk. Deep tendon reflexes are 1/4.  Sensory examination is intact to light touch.  The patient is able to state her full name at times, at other times cannot.  Unable to state where she is from, with whom she lives, or why she is here.  Speech is halting but no dysarthria.  Naming is grossly intact for "shoe" and "pen".  Attention appears to fluctuate.    Psych: Affect odd.  At times will not respond to questions, but  distractible with finger snap.  Other times looks off into space.  Other times speaks complete sentences, but at no time able to answer questions coherently.  No obvious hallucinations.     Labs on Admission:  I have personally reviewed following labs and imaging studies: CBC:  Recent Labs Lab 09/05/15 0218  WBC 12.0*  NEUTROABS 6.9  HGB 13.0  HCT 39.0  MCV 93.5  PLT 275   Basic Metabolic Panel:  Recent Labs Lab 09/05/15 0218  NA 142  K 3.8  CL 109  CO2 26  GLUCOSE 163*  BUN 28*  CREATININE 0.87  CALCIUM 9.8   GFR: CrCl cannot be calculated (Unknown ideal weight.).  Liver Function Tests:  Recent Labs Lab 09/05/15 0218  AST 19  ALT 15  ALKPHOS 82  BILITOT 0.6  PROT 7.4  ALBUMIN 4.1         Radiological Exams on Admission: Reports reviewed: Ct Head Wo Contrast  09/05/2015  CLINICAL DATA:  Fall with altered mental status. Initial encounter. EXAM: CT HEAD WITHOUT CONTRAST TECHNIQUE: Contiguous axial images were obtained from the base of the skull through the vertex without intravenous contrast. COMPARISON:  None. FINDINGS: Brain: No evidence of acute infarction, hemorrhage, hydrocephalus, or mass lesion/mass effect. Mild patchy microvascular ischemic change in the cerebral white matter. Normal cerebral volume for age. Vascular: Atherosclerotic calcification. Skull: Negative for fracture or focal lesion. Sinuses/Orbits: Bilateral cataract resection. Notable large leftward nasal septal spur with inferior turbinate deformation. Other: None. IMPRESSION: 1. No acute finding. 2. Mild chronic white matter disease. Electronically Signed   By: Marnee Spring M.D.   On: 09/05/2015 00:18    EKG: Independently reviewed. Rate 85, QTc 464, LPFB.    Assessment/Plan 1. Delirium:  The patient appears to have waxing and waning cognition and attention.  There is a likelihood that she has some underlying dementia, and she is currently tapered to a lower dose of alprazolam and  starting quetiapine, which could contribute to delirium.  No fever or tachycardia, so blood cultures deferred. -Neurology consult, recommendations appreciated -EEG is ordered -MRI brain is ordered  2. Diabetes:  -Hold home metformin -Sliding scale corrections as needed  3. HTN:  -Continue home carvedilol, lisinopril, aspirin, statin  4. Hypothyoidism:  -Check TSH -Continue home levothyroxine  5. Leukocytosis:  Etiology unclear. -Repeat CBC  6. Anxiety or depression: -Continue home fluoxetine -Hold home alprazolam -Hold other psychoactive medicines for now (new quetiapine)     DVT prophylaxis: Lovenox  Code Status: FULL  Family Communication: Son, left VM at [phone], no answer  Disposition Plan: Anticipate observation overnight, fluids, Neuro consult re: MRI brain and EEG.  Further disposition pending Neuro evaluation, possible GeriPsych eval. Consults called: Neuro Admission status: OBS, med surg At the point of initial evaluation, it is my clinical opinion that admission for OBSERVATION is reasonable and necessary because the patient's presenting complaints in the context of their chronic conditions represent sufficient risk of deterioration or significant morbidity to constitute reasonable grounds for close observation in the hospital setting, but that the patient may be medically stable for discharge from the hospital within 24 to 48 hours.    Medical decision making: Patient seen at 3:46 AM on 09/05/2015.  The patient was discussed with Dr. Wilkie Aye. What exists of the patient's chart was reviewed in depth and outside records were requested from [place].  Clinical condition: stable.        Alberteen Sam [facility] Pager [phone]

## 2015-09-06 ENCOUNTER — Observation Stay (HOSPITAL_COMMUNITY): Payer: Medicare Other

## 2015-09-06 DIAGNOSIS — R410 Disorientation, unspecified: Secondary | ICD-10-CM | POA: Diagnosis not present

## 2015-09-06 DIAGNOSIS — D72829 Elevated white blood cell count, unspecified: Secondary | ICD-10-CM | POA: Diagnosis not present

## 2015-09-06 LAB — GLUCOSE, CAPILLARY
GLUCOSE-CAPILLARY: 211 mg/dL — AB (ref 65–99)
GLUCOSE-CAPILLARY: 212 mg/dL — AB (ref 65–99)
GLUCOSE-CAPILLARY: 204 mg/dL — AB (ref 65–99)
Glucose-Capillary: 194 mg/dL — ABNORMAL HIGH (ref 65–99)

## 2015-09-06 LAB — HIV ANTIBODY (ROUTINE TESTING W REFLEX): HIV Screen 4th Generation wRfx: NONREACTIVE

## 2015-09-06 LAB — TSH: TSH: 0.042 u[IU]/mL — ABNORMAL LOW (ref 0.350–4.500)

## 2015-09-06 LAB — CREATININE, SERUM: CREATININE: 0.76 mg/dL (ref 0.44–1.00)

## 2015-09-06 MED ORDER — LORAZEPAM 0.5 MG PO TABS
0.5000 mg | ORAL_TABLET | Freq: Four times a day (QID) | ORAL | Status: DC | PRN
  Administered 2015-09-06 – 2015-09-08 (×2): 0.5 mg via ORAL
  Filled 2015-09-06 (×2): qty 1

## 2015-09-06 NOTE — Progress Notes (Signed)
PROGRESS NOTE    Tamara LollingDeanna P Shaw  UJW:[id number] DOB: 1945/04/19 DOA: 09/04/2015 PCP: Bjorn LoserKevin D Price, DO    Brief Narrative:  Pt still confused neuro and psych consulted. Waiting to see if this is drug induced delerium.  Today's plan-added a benzol back Ativan due to agitation, effective per nursing notes. Labs negative for other causes of delirium. HFE still pending. Checking TSH. MRI pending. Checking chest x-ray.  Assessment & Plan:   Principal Problem:   Delirium Active Problems:   Diabetes mellitus without complication (HCC)   Essential hypertension   Hypothyroidism   Leukocytosis   Assessment/Plan 1. Delirium:  The patient appears to have waxing and waning cognition and attention. There is a likelihood that she has some underlying dementia, and she is currently tapered to a lower dose of alprazolam and starting quetiapine, which could contribute to delirium. No fever or tachycardia, so blood cultures deferred. -Neurology consult, recommendations appreciated -EEG is ordered -MRI brain is ordered - pending, pt behavior makes it difficult - will do seroquel per psych recs  2. Diabetes:  -Holding home metformin -Sliding scale corrections as needed  3. HTN:  -Continue home carvedilol, lisinopril, aspirin, statin  4. Hypothyoidism:  -Check TSH and is low 0.042 -Continue home levothyroxine  5. Leukocytosis:  Etiology unclear. -Repeat CBC in AM - CXR neg, UA neg  6. Anxiety or depression: -Continue home fluoxetine -Hold home alprazolam -Hold other psychoactive medicines for now (new quetiapine)   DVT prophylaxis:lovenox Code Status: full Family Communication: unavailable Disposition Plan: TBD   Consultants:   Psych, Neuro  Procedures:  n/a  Antimicrobials:  n/A  Subjective: Pt states angles have spoken to her. And she has spoken to [place]. Denies pain. Denies fever.  Objective: Filed Vitals:   09/06/15 0112 09/06/15 0539 09/06/15 0801  09/06/15 1007  BP: 133/91 131/97 130/88 128/71  Pulse: 80 80 85 69  Temp: 98.1 F (36.7 C) 98.5 F (36.9 C)  98.3 F (36.8 C)  TempSrc: Oral Oral  Oral  Resp: 18 20  17   Weight:      SpO2: 98% 98%  100%    Intake/Output Summary (Last 24 hours) at 09/06/15 1144 Last data filed at 09/06/15 0100  Gross per 24 hour  Intake    120 ml  Output      0 ml  Net    120 ml   Filed Weights   09/05/15 0548  Weight: 73.982 kg (163 lb 1.6 oz)    Examination:  General exam: Appears calm and comfortable  Respiratory system: Clear to auscultation. Respiratory effort normal. Cardiovascular system: S1 & S2 heard, RRR. No JVD, murmurs, rubs, gallops or clicks. No pedal edema. Gastrointestinal system: Abdomen is nondistended, soft and nontender. No organomegaly or masses felt. Normal bowel sounds heard. Central nervous system: Alert and oriented. No focal neurological deficits. Extremities: Symmetric 5 x 5 power. Skin: No rashes, lesions or ulcers Psychiatry: Judgement and insight appear normal. Mood & affect appropriate.     Data Reviewed: I have personally reviewed following labs and imaging studies  CBC:  Recent Labs Lab 09/05/15 0218  WBC 12.0*  NEUTROABS 6.9  HGB 13.0  HCT 39.0  MCV 93.5  PLT 275   Basic Metabolic Panel:  Recent Labs Lab 09/05/15 0218 09/06/15 0344  NA 142  --   K 3.8  --   CL 109  --   CO2 26  --   GLUCOSE 163*  --   BUN 28*  --  CREATININE 0.87 0.76  CALCIUM 9.8  --    GFR: CrCl cannot be calculated (Unknown ideal weight.). Liver Function Tests:  Recent Labs Lab 09/05/15 0218  AST 19  ALT 15  ALKPHOS 82  BILITOT 0.6  PROT 7.4  ALBUMIN 4.1   No results for input(s): LIPASE, AMYLASE in the last 168 hours. No results for input(s): AMMONIA in the last 168 hours. Coagulation Profile: No results for input(s): INR, PROTIME in the last 168 hours. Cardiac Enzymes: No results for input(s): CKTOTAL, CKMB, CKMBINDEX, TROPONINI in the last  168 hours. BNP (last 3 results) No results for input(s): PROBNP in the last 8760 hours. HbA1C: No results for input(s): HGBA1C in the last 72 hours. CBG:  Recent Labs Lab 09/05/15 0627 09/05/15 1305 09/05/15 1642 09/05/15 2102 09/06/15 0606  GLUCAP 128* 193* 157* 188* 211*   Lipid Profile: No results for input(s): CHOL, HDL, LDLCALC, TRIG, CHOLHDL, LDLDIRECT in the last 72 hours. Thyroid Function Tests: No results for input(s): TSH, T4TOTAL, FREET4, T3FREE, THYROIDAB in the last 72 hours. Anemia Panel:  Recent Labs  09/05/15 2030  VITAMINB12 1018*   Sepsis Labs: No results for input(s): PROCALCITON, LATICACIDVEN in the last 168 hours.  No results found for this or any previous visit (from the past 240 hour(s)).       Radiology Studies: Ct Head Wo Contrast  09/05/2015  CLINICAL DATA:  Fall with altered mental status. Initial encounter. EXAM: CT HEAD WITHOUT CONTRAST TECHNIQUE: Contiguous axial images were obtained from the base of the skull through the vertex without intravenous contrast. COMPARISON:  None. FINDINGS: Brain: No evidence of acute infarction, hemorrhage, hydrocephalus, or mass lesion/mass effect. Mild patchy microvascular ischemic change in the cerebral white matter. Normal cerebral volume for age. Vascular: Atherosclerotic calcification. Skull: Negative for fracture or focal lesion. Sinuses/Orbits: Bilateral cataract resection. Notable large leftward nasal septal spur with inferior turbinate deformation. Other: None. IMPRESSION: 1. No acute finding. 2. Mild chronic white matter disease. Electronically Signed   By: Marnee Spring M.D.   On: 09/05/2015 00:18        Scheduled Meds: . aspirin EC  81 mg Oral Daily  . carvedilol  6.25 mg Oral BID WC  . enoxaparin (LOVENOX) injection  40 mg Subcutaneous Q24H  . FLUoxetine  20 mg Oral Daily  . insulin aspart  0-5 Units Subcutaneous QHS  . insulin aspart  0-9 Units Subcutaneous TID WC  . levothyroxine  75 mcg  Oral QAC breakfast  . lisinopril  5 mg Oral Daily  . pravastatin  80 mg Oral Daily  . QUEtiapine  25 mg Oral TID   Continuous Infusions:       Time spent: 42   Haydee Salter, MD [facility] Pager [phone] If 7PM-7AM, please contact night-coverage [email or web address] Password [facility] 09/06/2015, 11:44 AM
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
## 2015-09-06 NOTE — Progress Notes (Signed)
Pt unable to answer admission questions at this time, family not at bedside.

## 2015-09-06 NOTE — Progress Notes (Signed)
Subjective: Agitated and attempting to get out of bed.  Exam: Filed Vitals:   09/06/15 0539 09/06/15 0801  BP: 131/97 130/88  Pulse: 80 85  Temp: 98.5 F (36.9 C)   Resp: 20     HEENT-  Normocephalic, no lesions, without obvious abnormality.  Normal external eye and conjunctiva.   Normal   external ears. Normal external nose.  Normal pharynx. Cardiovascular- S1, S2 normal, pulses palpable throughout   Lungs- chest clear, no wheezing, rales, normal symmetric air entry Abdomen- normal findings: bowel sounds normal Extremities- less then 2 second capillary refill and no edema Lymph-no adenopathy palpable Musculoskeletal-no joint tenderness, deformity or swelling Skin-dry    Gen: In bed, agitated MS: awake, alert, oriented to person, place, and current/past president, unable to state age, month, or year CN: II-XII intact Motor: MAE equally, BUE tremors Sensory: intact ZOX:WRUEAVTR:unable to test as she began screaming when attempted  Pertinent Labs/Diagnostics: B12 1018 EEG: Abnormal EEG due to generalized background slowing, indicative of diffuse cerebral dysfunction. This is a nonspecific finding which may be due to toxic-metabolic etiology or other diffuse physiologic abnormality.  April Pugh, MSN, RN Neurology [phone]  Impression:  70 y.o. female patient with history of depression, likely underlying dementia, hypothyroidism, diabetes, hypertension who is continuing to have hallucinations. Nursing reports increased agitation this morning. I continue to suspect delirium with underlying dementia, though exact physiological stressor has not been identified.    Recommendations: 1) agree with low dose seroquel.  2) will continue to follow.   Ritta SlotMcNeill Khaleel Beckom, MD [facility] [phone]  If 7pm- 7am, please page neurology on call as listed in AMION.  09/06/2015, 9:32 AM

## 2015-09-06 NOTE — Progress Notes (Signed)
Pt calmer after seroquel and ativan. Pt very religiously focused. Staff walked by and pt was saying " I command in the name of Jesus to sit up".

## 2015-09-06 NOTE — Evaluation (Signed)
Physical Therapy Evaluation Patient Details Name: Derrell LollingDeanna P Cooksey MRN: [id number] DOB: 06-24-1945 Today's Date: 09/06/2015   History of Present Illness  Doreena P Illene BolusGrimsley is a 70 y.o. female patient with history of depression, unspecified cognitive disorder, hypothyroidism, diabetes, hypertension who is consulted for hallucinations.   Clinical Impression  Patient presents with AMS, poor balance and decreased safety awareness with high fall risk.  She will benefit from skilled PT in the acute setting with follow up SNF level rehab at d/c.     Follow Up Recommendations SNF    Equipment Recommendations  Other (comment) (TBA at next venue)    Recommendations for Other Services       Precautions / Restrictions Precautions Precautions: Fall      Mobility  Bed Mobility Overal bed mobility: Needs Assistance Bed Mobility: Supine to Sit;Sit to Supine     Supine to sit: Mod assist Sit to supine: Mod assist   General bed mobility comments: assist for hips to EOB and for preventing posterior LOB, to supine assist for legs into bed  Transfers Overall transfer level: Needs assistance Equipment used: Rolling walker (2 wheeled);None Transfers: Pharmacologisttand Pivot Transfers;Sit to/from Stand Sit to Stand: Min assist Stand pivot transfers: Mod assist       General transfer comment: assist initially to pivot to [facility] no device, pt with some posterior bias, then [facility] with min A for balance  Ambulation/Gait Ambulation/Gait assistance: Min assist Ambulation Distance (Feet): 150 Feet Assistive device: Rolling walker (2 wheeled) Gait Pattern/deviations: Step-through pattern;Wide base of support;Shuffle;Decreased stride length;Decreased weight shift to left     General Gait Details: assist for walker management and for balance  Stairs            Wheelchair Mobility    Modified Rankin (Stroke Patients Only)       Balance Overall balance assessment: Needs assistance        Postural control: Posterior lean Standing balance support: Bilateral upper extremity supported;No upper extremity supported Standing balance-Leahy Scale: Poor Standing balance comment: with UE support on walker can stand with S (or with knees braced against bed or BSC), with walker and anterior lean can stand with minguard for safety                             Pertinent Vitals/Pain Pain Assessment: No/denies pain    Home Living Family/patient expects to be discharged to:: Skilled nursing facility                      Prior Function           Comments: unknown PLOF due to patient confused and no family available.  Seems to have been functioning poorly as per admission notes     Hand Dominance        Extremity/Trunk Assessment   Upper Extremity Assessment: Overall WFL for tasks assessed           Lower Extremity Assessment: Overall WFL for tasks assessed         Communication   Communication: No difficulties  Cognition Arousal/Alertness: Awake/alert Behavior During Therapy: Impulsive Overall Cognitive Status: No family/caregiver present to determine baseline cognitive functioning Area of Impairment: Orientation;Memory;Following commands;Safety/judgement;Awareness;Problem solving;Attention Orientation Level: Disoriented to;Person;Place;Time;Situation Current Attention Level: Sustained Memory: Decreased short-term memory Following Commands: Follows one step commands with increased time;Follows multi-step commands inconsistently Safety/Judgement: Decreased awareness of deficits;Decreased awareness of safety     General Comments:  internally distracted, but can attend to mobility tasks while verbalizing about her birthday when someone surprised her with real pearls.  Did change story later that I bought her the birthday present that surprised her.    General Comments General comments (skin integrity, edema, etc.): Patient wide eyed and  talking throughout session.  Sat on BSC x 3 without voiding; combed her hair to remove tangles while she sat    Exercises        Assessment/Plan    PT Assessment Patient needs continued PT services  PT Diagnosis Abnormality of gait;Altered mental status;Generalized weakness   PT Problem List Decreased balance;Decreased mobility;Decreased safety awareness;Decreased knowledge of use of DME;Decreased cognition;Decreased strength  PT Treatment Interventions DME instruction;Gait training;Balance training;Functional mobility training;Patient/family education;Therapeutic activities;Therapeutic exercise   PT Goals (Current goals can be found in the Care Plan section) Acute Rehab PT Goals PT Goal Formulation: Patient unable to participate in goal setting Time For Goal Achievement: 09/13/15 Potential to Achieve Goals: Fair    Frequency Min 3X/week   Barriers to discharge        Co-evaluation               End of Session Equipment Utilized During Treatment: Gait belt Activity Tolerance: Patient tolerated treatment well Patient left: in bed;with call bell/phone within reach;with bed alarm set;Other (comment) (and 4 rails up)           Time: 1610-9604 PT Time Calculation (min) (ACUTE ONLY): 33 min   Charges:   PT Evaluation $PT Eval High Complexity: 1 Procedure PT Treatments $Gait Training: 8-22 mins   PT G Codes:        Elray Mcgregor 04-Oct-2015, 3:25 PM  Sheran Lawless, PT [phone] 04-Oct-2015

## 2015-09-06 NOTE — Progress Notes (Signed)
Pt alert to name and place. But confused about food she is eating, thought grits were banana pudding. Pt this morning was holding hands straight up in the hair praying, talks about making "lots of mistakes in the past and Jesus loving her". Rn has put socks on pt now 3x, pt keeps pulling socks off. Pt has floor matt in room. In last 10 minutes pt has tried to get out of bed 3 times. Putting feet over the side rails.    At the moment pt took socks off again. Even though rn instructed pt to keep socks on.

## 2015-09-06 NOTE — Progress Notes (Signed)
Pt has continually been talking to herself throughout the day, almost non stop. Raises arms and is praying at times.

## 2015-09-07 ENCOUNTER — Observation Stay (HOSPITAL_COMMUNITY): Payer: Medicare Other

## 2015-09-07 DIAGNOSIS — D729 Disorder of white blood cells, unspecified: Secondary | ICD-10-CM | POA: Diagnosis not present

## 2015-09-07 DIAGNOSIS — F1290 Cannabis use, unspecified, uncomplicated: Secondary | ICD-10-CM | POA: Diagnosis present

## 2015-09-07 DIAGNOSIS — R1084 Generalized abdominal pain: Secondary | ICD-10-CM | POA: Diagnosis not present

## 2015-09-07 DIAGNOSIS — F339 Major depressive disorder, recurrent, unspecified: Secondary | ICD-10-CM | POA: Diagnosis not present

## 2015-09-07 DIAGNOSIS — I679 Cerebrovascular disease, unspecified: Secondary | ICD-10-CM | POA: Diagnosis not present

## 2015-09-07 DIAGNOSIS — I10 Essential (primary) hypertension: Secondary | ICD-10-CM | POA: Diagnosis not present

## 2015-09-07 DIAGNOSIS — R109 Unspecified abdominal pain: Secondary | ICD-10-CM | POA: Diagnosis not present

## 2015-09-07 DIAGNOSIS — R440 Auditory hallucinations: Secondary | ICD-10-CM | POA: Diagnosis present

## 2015-09-07 DIAGNOSIS — R112 Nausea with vomiting, unspecified: Secondary | ICD-10-CM | POA: Diagnosis not present

## 2015-09-07 DIAGNOSIS — T381X1A Poisoning by thyroid hormones and substitutes, accidental (unintentional), initial encounter: Secondary | ICD-10-CM | POA: Diagnosis present

## 2015-09-07 DIAGNOSIS — R262 Difficulty in walking, not elsewhere classified: Secondary | ICD-10-CM | POA: Diagnosis not present

## 2015-09-07 DIAGNOSIS — R443 Hallucinations, unspecified: Secondary | ICD-10-CM | POA: Diagnosis not present

## 2015-09-07 DIAGNOSIS — E039 Hypothyroidism, unspecified: Secondary | ICD-10-CM | POA: Diagnosis not present

## 2015-09-07 DIAGNOSIS — D72829 Elevated white blood cell count, unspecified: Secondary | ICD-10-CM

## 2015-09-07 DIAGNOSIS — Z79899 Other long term (current) drug therapy: Secondary | ICD-10-CM | POA: Diagnosis not present

## 2015-09-07 DIAGNOSIS — Z7982 Long term (current) use of aspirin: Secondary | ICD-10-CM | POA: Diagnosis not present

## 2015-09-07 DIAGNOSIS — Z7984 Long term (current) use of oral hypoglycemic drugs: Secondary | ICD-10-CM | POA: Diagnosis not present

## 2015-09-07 DIAGNOSIS — E119 Type 2 diabetes mellitus without complications: Secondary | ICD-10-CM | POA: Diagnosis not present

## 2015-09-07 DIAGNOSIS — G9340 Encephalopathy, unspecified: Secondary | ICD-10-CM | POA: Diagnosis not present

## 2015-09-07 DIAGNOSIS — F329 Major depressive disorder, single episode, unspecified: Secondary | ICD-10-CM | POA: Diagnosis present

## 2015-09-07 DIAGNOSIS — F05 Delirium due to known physiological condition: Secondary | ICD-10-CM | POA: Diagnosis not present

## 2015-09-07 DIAGNOSIS — R4182 Altered mental status, unspecified: Secondary | ICD-10-CM | POA: Diagnosis not present

## 2015-09-07 DIAGNOSIS — F0390 Unspecified dementia without behavioral disturbance: Secondary | ICD-10-CM | POA: Diagnosis not present

## 2015-09-07 DIAGNOSIS — F419 Anxiety disorder, unspecified: Secondary | ICD-10-CM | POA: Diagnosis present

## 2015-09-07 DIAGNOSIS — M6281 Muscle weakness (generalized): Secondary | ICD-10-CM | POA: Diagnosis not present

## 2015-09-07 DIAGNOSIS — E0590 Thyrotoxicosis, unspecified without thyrotoxic crisis or storm: Secondary | ICD-10-CM | POA: Diagnosis not present

## 2015-09-07 DIAGNOSIS — F0391 Unspecified dementia with behavioral disturbance: Secondary | ICD-10-CM | POA: Diagnosis not present

## 2015-09-07 DIAGNOSIS — Z741 Need for assistance with personal care: Secondary | ICD-10-CM | POA: Diagnosis not present

## 2015-09-07 DIAGNOSIS — R410 Disorientation, unspecified: Secondary | ICD-10-CM | POA: Diagnosis not present

## 2015-09-07 DIAGNOSIS — R1030 Lower abdominal pain, unspecified: Secondary | ICD-10-CM | POA: Diagnosis not present

## 2015-09-07 LAB — GLUCOSE, CAPILLARY
GLUCOSE-CAPILLARY: 178 mg/dL — AB (ref 65–99)
GLUCOSE-CAPILLARY: 169 mg/dL — AB (ref 65–99)
GLUCOSE-CAPILLARY: 208 mg/dL — AB (ref 65–99)
GLUCOSE-CAPILLARY: 219 mg/dL — AB (ref 65–99)

## 2015-09-07 LAB — RENAL FUNCTION PANEL
Albumin: 3.6 g/dL (ref 3.5–5.0)
Anion gap: 8 (ref 5–15)
BUN: 16 mg/dL (ref 6–20)
CHLORIDE: 106 mmol/L (ref 101–111)
CO2: 25 mmol/L (ref 22–32)
CREATININE: 0.82 mg/dL (ref 0.44–1.00)
Calcium: 9.8 mg/dL (ref 8.9–10.3)
GFR calc Af Amer: >60 mL/min (ref >60)
GLUCOSE: 162 mg/dL — AB (ref 65–99)
POTASSIUM: 3.6 mmol/L (ref 3.5–5.1)
Phosphorus: 3.4 mg/dL (ref 2.5–4.6)
Sodium: 139 mmol/L (ref 135–145)

## 2015-09-07 LAB — AMMONIA: AMMONIA: 33 umol/L (ref 9–35)

## 2015-09-07 MED ORDER — DEXTROSE 5 % IV SOLN
1000.0000 mg | Freq: Once | INTRAVENOUS | Status: AC
  Administered 2015-09-07: 1000 mg via INTRAVENOUS
  Filled 2015-09-07: qty 10

## 2015-09-07 MED ORDER — DIVALPROEX SODIUM 500 MG PO DR TAB
500.0000 mg | DELAYED_RELEASE_TABLET | Freq: Two times a day (BID) | ORAL | Status: DC
  Administered 2015-09-08 – 2015-09-12 (×9): 500 mg via ORAL
  Filled 2015-09-07 (×9): qty 1

## 2015-09-07 NOTE — Progress Notes (Signed)
Pt to radiology.

## 2015-09-07 NOTE — Clinical Social Work Note (Signed)
Clinical Social Work Assessment  Patient Details  Name: Tamara LollingDeanna P Serio MRN: [id number] Date of Birth: 04/21/1945  Date of referral:  09/07/15               Reason for consult:  Facility Placement                Permission sought to share information with:  Facility Medical sales representativeContact Representative, Family Supports Permission granted to share information::  Yes, Verbal Permission Granted  Name::     Civil Service fast streamerDonald  Agency::  SNFs  Relationship::  Son  Contact Information:  [phone]  Housing/Transportation Living arrangements for the past 2 months:  Single Family Home Source of Information:  Adult Children Patient Interpreter Needed:  None Criminal Activity/Legal Involvement Pertinent to Current Situation/Hospitalization:  No - Comment as needed Significant Relationships:  Adult Children Lives with:  Adult Children Do you feel safe going back to the place where you live?  No Need for family participation in patient care:  Yes (Comment)  Care giving concerns:  CSW received referral for possible SNF placement at time of discharge. Patient is disoriented. CSW spoke with patient's son regarding PT recommendation of SNF placement at time of discharge. Per patient's son, patient came to live with her daughter for a while but was not taking her medication correctly, so patient went to live with her son. He has four grandchildren living with him and does not feel the patient is safe to be around them when he works during the day and cannot provide supervision. He reported that patient started buying expensive things and talking about Jesus. CSW to continue to follow and assist with discharge planning needs.   Social Worker assessment / plan:  Patient's son understands that Medicare will not pay for SNF under observation status. He states that patient receives a Tree surgeonsocial security check every month and would like to investigate that avenue for payment purposes.   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  Patient/Family's Understanding of and Emotional Response to Diagnosis, Current Treatment, and Prognosis:  Patient is realistic regarding therapy needs. No questions/concerns about plan or treatment.    Emotional Assessment Appearance:  Appears stated age Attitude/Demeanor/Rapport:  Unable to Assess Affect (typically observed):  Unable to Assess Orientation:   (Disoriented x4) Alcohol / Substance use:  Not Applicable Psych involvement (Current and /or in the community):  Yes (Comment)  Discharge Needs  Concerns to be addressed:  No discharge needs identified Readmission within the last 30 days:  No Current discharge risk:  Cognitively Impaired Barriers to Discharge:  Continued Medical Work up   [employer] S Zsofia Prout, LCSWA 09/07/2015, 11:53 AM

## 2015-09-07 NOTE — Progress Notes (Signed)
Update: Patient is not appropriate for SNF level care. CSW spoke with patient's son regarding ALF facilities. Patient's son will need to take patient home with him until he can arrange for ALF placement by using patient's social security check as a payor source.  CSW will offer resources.  Osborne Cascoadia Nekita Pita LCSWA [phone]

## 2015-09-07 NOTE — Evaluation (Signed)
Occupational Therapy Evaluation Patient Details Name: Tamara Shaw MRN: [id number] DOB: 1946-01-13 Today's Date: 09/07/2015    History of Present Illness Tamara Shaw is a 70 y.o. female patient with history of depression, unspecified cognitive disorder, hypothyroidism, diabetes, hypertension who is consulted for hallucinations.    Clinical Impression   Unsure of pts PLOF secondary to cognitive impairments. Currently pt requires min assist for basic transfers and min-mod assist for ADL. Pt requires continues multimodal cues for initiation and participation in activities during session. Recommending SNF for follow up to maximize pts independence and safety with ADLs and functional mobility. Pt would benefit from continued skilled OT to address established goals.    Follow Up Recommendations  SNF;Supervision/Assistance - 24 hour    Equipment Recommendations  Other (comment) (TBD at next venue)    Recommendations for Other Services       Precautions / Restrictions Precautions Precautions: Fall Restrictions Weight Bearing Restrictions: No      Mobility Bed Mobility Overal bed mobility: Needs Assistance Bed Mobility: Supine to Sit;Sit to Supine     Supine to sit: Mod assist (for LEs to EOB and to pull up into sitting position) Sit to supine: Min assist (for controlled descent to bed level)   General bed mobility comments: Max multimodal cues to initiate. Pt able to scoot hips to EOB without physical assist.  Transfers Overall transfer level: Needs assistance Equipment used: 1 person hand held assist Transfers: Sit to/from [employer] Sit to Stand: Min assist Stand pivot transfers: Min assist       General transfer comment: Bil hand held assist for sit to stand x2 and stand pivot x2.    Balance Overall balance assessment: Needs assistance Sitting-balance support: Feet supported;No upper extremity supported Sitting balance-Leahy Scale:  Poor Sitting balance - Comments: R lateral lean when attempting to don socks   Standing balance support: Bilateral upper extremity supported;During functional activity Standing balance-Leahy Scale: Poor Standing balance comment: Requires bil UE support for balance                            ADL Overall ADL's : Needs assistance/impaired Eating/Feeding: Set up;Bed level Eating/Feeding Details (indicate cue type and reason): Pt eating muffin at end of session with set up. Grooming: Minimal assistance;Sitting   Upper Body Bathing: Moderate assistance;Sitting   Lower Body Bathing: Moderate assistance;Sit to/from stand   Upper Body Dressing : Moderate assistance;Sitting   Lower Body Dressing: Moderate assistance;Sit to/from stand Lower Body Dressing Details (indicate cue type and reason): Pt able to identify "sock" when asked what is this? and able to initiate donning. Pt able to don R sock with support for balance in sitting. Pt attempting to don second sock over top of R sock despite max verbal cues. Assist provided for correct donning.  Toilet Transfer: Minimal Chartered loss adjuster Details (indicate cue type and reason): With pt holding onto therapists bil arms. Pt reports she needs to void but does not attempt once on [facility]         Functional mobility during ADLs: Minimal assistance (hand held assist for stand pivot only) General ADL Comments: Pt able to participate in familiar activities but requires max verbal cueing. Continuously talking throughout session about various topics, currently perseverating on Tamara Shaw and going to the movies.     Vision Additional Comments: Difficult to assess due to impaired cognition.   Perception     Praxis  Pertinent Vitals/Pain Pain Assessment: No/denies pain     Hand Dominance     Extremity/Trunk Assessment Upper Extremity Assessment Upper Extremity Assessment: Overall WFL for tasks assessed    Lower Extremity Assessment Lower Extremity Assessment: Defer to PT evaluation       Communication Communication Communication: No difficulties   Cognition Arousal/Alertness: Awake/alert Behavior During Therapy: Impulsive;Restless Overall Cognitive Status: No family/caregiver present to determine baseline cognitive functioning Area of Impairment: Orientation;Following commands;Safety/judgement;Attention Orientation Level: Disoriented to;Person;Place;Time;Situation Current Attention Level: Sustained   Following Commands: Follows one step commands with increased time;Follows one step commands inconsistently Safety/Judgement: Decreased awareness of deficits;Decreased awareness of safety     General Comments: Pt perseverating on Tamara CornersJohn Shaw and going to the movies. Pt able to follow simple one step commands at times for familiar activities.   General Comments       Exercises       Shoulder Instructions      Home Living Family/patient expects to be discharged to:: Unsure                                 Additional Comments: Pt unable to provide home living information      Prior Functioning/Environment          Comments: Pt unable to provide PLOF secondary to confusion. No family present. Per chart review, up until ~2 weeks ago pt was independent with ADL and was living on her own.    OT Diagnosis: Cognitive deficits;Altered mental status   OT Problem List: Impaired balance (sitting and/or standing);Decreased coordination;Decreased cognition;Decreased safety awareness;Decreased knowledge of use of DME or AE   OT Treatment/Interventions: Self-care/ADL training;Energy conservation;DME and/or AE instruction;Therapeutic activities;Patient/family education;Balance training;Cognitive remediation/compensation    OT Goals(Current goals can be found in the care plan section) Acute Rehab OT Goals Patient Stated Goal: none stated OT Goal Formulation: Patient unable to  participate in goal setting Time For Goal Achievement: 09/21/15 Potential to Achieve Goals: Fair ADL Goals Pt Will Perform Grooming: with supervision;standing Pt Will Perform Upper Body Bathing: with supervision;sitting Pt Will Perform Lower Body Bathing: with supervision;sit to/from stand Pt Will Transfer to Toilet: with supervision;ambulating;regular height toilet Pt Will Perform Toileting - Clothing Manipulation and hygiene: with supervision;sit to/from stand  OT Frequency: Min 2X/week   Barriers to D/C:            Co-evaluation              End of Session Nurse Communication: Mobility status  Activity Tolerance: Patient tolerated treatment well Patient left: in bed;with call bell/phone within reach;with bed alarm set   Time: [id number] OT Time Calculation (min): 11 min Charges:  OT General Charges $OT Visit: 1 Procedure OT Evaluation $OT Eval High Complexity: 1 Procedure G-Codes: OT G-codes **NOT FOR INPATIENT CLASS** Functional Assessment Tool Used: Clinical judgement Functional Limitation: Self care Self Care Current Status ([id number]): At least 20 percent but less than 40 percent impaired, limited or restricted Self Care Goal Status (J1914(G8988): At least 1 percent but less than 20 percent impaired, limited or restricted   Gaye AlkenBailey A Corinn Stoltzfus M.S., OTR/L Pager: [phone]  09/07/2015, 8:26 AM

## 2015-09-07 NOTE — Progress Notes (Signed)
Subjective: States [place] is in [place] and praising the [place].  States she is ready to go home.   Exam: Filed Vitals:   09/07/15 0545 09/07/15 0908  BP: 124/80 126/79  Pulse: 80 76  Temp: 98.4 F (36.9 C) 98.7 F (37.1 C)  Resp: 18 18   HEENT- Normocephalic, no lesions, without obvious abnormality. Normal external eye and conjunctiva. Normal external ears. Normal external nose. Normal pharynx. Cardiovascular- S1, S2 normal, pulses palpable throughout  Lungs- chest clear, no wheezing, rales, normal symmetric air entry Abdomen- normal findings: bowel sounds normal Extremities- less then 2 second capillary refill and no edema Lymph-no adenopathy palpable Musculoskeletal-no joint tenderness, deformity or swelling Skin-dry    Gen: In bed, NAD MS: awake, alert, oriented to person only, having hallucinations of children/angels in the room  CN: II-XII intact Motor: MAE equally, BUE tremors Sensory: intact   Pertinent Labs/Diagnostics: None  April Pugh, MSN, RN Neurology [phone]  Impression: 70 y.o. female patient with history of depression, likely underlying dementia, hypothyroidism, diabetes, hypertension who is continuing to have hallucinations. Nursing noted decreased agitation over the last evening and this morning, though she continues with hallucinations. I continue to suspect delirium with underlying dementia.   I have found depakote occasionally useful in refractory delirium and will try this today.    Recommendations: 1) Depakote 1gm once, then 500mg  BID.  2) would not continue this long term if no benefit.  3) will continue to follow.    Ritta SlotMcNeill Zlaty Alexa, MD [facility] [phone]  If 7pm- 7am, please page neurology on call as listed in AMION.  09/07/2015, 12:18 PM

## 2015-09-07 NOTE — Progress Notes (Signed)
rn spoke with md, who is going to discuss tomorrow with neuro MRI/ if pt needs anaesthesia for MRI. Already attempted MRI once with haldol and was unsuccessful. rn called MRI staff and made them aware of current situation.

## 2015-09-07 NOTE — Progress Notes (Signed)
PROGRESS NOTE    Tamara Shaw  RUE:[id number] DOB: 1945-12-06 DOA: 09/04/2015 PCP: Bjorn Loser, DO    Brief Narrative:  Pt still confused neuro and psych consulted. Waiting to see if this is drug induced delerium. Holding benzo and synthroid.  Today's plan - planning for MRI  Assessment & Plan:   Principal Problem:   Delirium Active Problems:   Diabetes mellitus without complication (HCC)   Essential hypertension   Hypothyroidism   Leukocytosis   Assessment/Plan 1. Delirium:  The patient appears to have waxing and waning cognition and attention. There is a likelihood that she has some underlying dementia, and she is currently tapered to a lower dose of alprazolam and starting quetiapine, which could contribute to delirium. No fever or tachycardia, so blood cultures deferred. -Neurology consult, recommendations appreciated -EEG is ordered -MRI brain is ordered - pending, pt behavior makes it difficult - will do seroquel per psych recs - CBC in AM  2. Diabetes:  -Holding home metformin -Sliding scale corrections as needed  3. HTN:  -Continue home carvedilol, lisinopril, aspirin, statin  4. Hypothyoidism:  -Check TSH and is low 0.042 (hyperthyroid) -holding home levothyroxine  5. Leukocytosis:  Etiology unclear. -Repeat CBC in AM - CXR neg, UA neg, Abd XR neg  6. Anxiety or depression: -Continue home fluoxetine -Hold home alprazolam -Hold other psychoactive medicines for now (new quetiapine)   DVT prophylaxis:lovenox Code Status: full Family Communication: unavailable Disposition Plan: TBD   Consultants:   Psych, Neuro  Procedures:  n/a  Antimicrobials:  n/A  Subjective: Pt states Jesus and [name] are with her in the car. C/o generalized ab pain. Denies fever.  Objective: Filed Vitals:   09/07/15 0545 09/07/15 0908 09/07/15 1313 09/07/15 1807  BP: 124/80 126/79 113/73 123/87  Pulse: 80 76 80 79  Temp: 98.4 F (36.9 C) 98.7  F (37.1 C) 98.4 F (36.9 C) 98.5 F (36.9 C)  TempSrc: Oral Oral Oral Oral  Resp: Weight:      SpO2: 94% 96% 95% 100%   No intake or output data in the 24 hours ending 09/07/15 1901 Filed Weights   09/05/15 0548  Weight: 73.982 kg (163 lb 1.6 oz)    Examination:  General exam: Appears calm and comfortable  Respiratory system: Clear to auscultation. Respiratory effort normal. Cardiovascular system: S1 & S2 heard, RRR. No JVD, murmurs, rubs, gallops or clicks. No pedal edema. Gastrointestinal system: Abdomen is nondistended, soft and With generalized tenderness . No organomegaly or masses felt. Normal bowel sounds heard. Central nervous system: Alert and oriented. No focal neurological deficits. Extremities: Symmetric 5 x 5 power. Skin: No rashes, lesions or ulcers Psychiatry: Judgement and insight appear normal. Mood & affect appropriate.     Data Reviewed: I have personally reviewed following labs and imaging studies  CBC:  Recent Labs Lab 09/05/15 0218  WBC 12.0*  NEUTROABS 6.9  HGB 13.0  HCT 39.0  MCV 93.5  PLT 275   Basic Metabolic Panel:  Recent Labs Lab 09/05/15 0218 09/06/15 0344 09/07/15 0544  NA 142  --  139  K 3.8  --  3.6  CL 109  --  106  CO2 26  --  25  GLUCOSE 163*  --  162*  BUN 28*  --  16  CREATININE 0.87 0.76 0.82  CALCIUM 9.8  --  9.8  PHOS  --   --  3.4   GFR: CrCl cannot be calculated (Unknown ideal  weight.). Liver Function Tests:  Recent Labs Lab 09/05/15 0218 09/07/15 0544  AST 19  --   ALT 15  --   ALKPHOS 82  --   BILITOT 0.6  --   PROT 7.4  --   ALBUMIN 4.1 3.6   No results for input(s): LIPASE, AMYLASE in the last 168 hours. No results for input(s): AMMONIA in the last 168 hours. Coagulation Profile: No results for input(s): INR, PROTIME in the last 168 hours. Cardiac Enzymes: No results for input(s): CKTOTAL, CKMB, CKMBINDEX, TROPONINI in the last 168 hours. BNP (last 3 results) No results for  input(s): PROBNP in the last 8760 hours. HbA1C: No results for input(s): HGBA1C in the last 72 hours. CBG:  Recent Labs Lab 09/06/15 1634 09/06/15 2058 09/07/15 0631 09/07/15 1153 09/07/15 1617  GLUCAP 204* 194* 178* 169* 208*   Lipid Profile: No results for input(s): CHOL, HDL, LDLCALC, TRIG, CHOLHDL, LDLDIRECT in the last 72 hours. Thyroid Function Tests:  Recent Labs  09/06/15 1202  TSH 0.042*   Anemia Panel:  Recent Labs  09/05/15 2030  VITAMINB12 1018*   Sepsis Labs: No results for input(s): PROCALCITON, LATICACIDVEN in the last 168 hours.  No results found for this or any previous visit (from the past 240 hour(s)).       Radiology Studies: Dg Chest Port 1 View  09/06/2015  CLINICAL DATA:  Leukocytosis EXAM: PORTABLE CHEST 1 VIEW COMPARISON:  None. FINDINGS: Surgical hardware is partially visualized overlying the lower cervical spine. Normal heart size. Atherosclerotic aortic arch. Otherwise normal mediastinal contour. No pneumothorax. No pleural effusion. Lungs appear clear, with no acute consolidative airspace disease and no pulmonary edema. IMPRESSION: No active disease. Electronically Signed   By: Delbert PhenixJason A Poff M.D.   On: 09/06/2015 12:57   Dg Abd 2 Views  09/07/2015  CLINICAL DATA:  Lower abdominal pain comment nausea, vomiting and diarrhea. No time course given. EXAM: ABDOMEN - 2 VIEW COMPARISON:  None. FINDINGS: The lung bases are grossly clear. There is moderate stool throughout the colon and down into the rectum. Scattered air-filled small bowel loops without distention or air-fluid levels to suggest obstruction. No free air. The soft tissue shadows are maintained. Lumbar fusion hardware noted. Extensive vascular calcifications with this iliac artery stents. IMPRESSION: No plain film findings for an acute abdominal process. No findings for obstruction or perforation. Electronically Signed   By: Rudie MeyerP.  Gallerani M.D.   On: 09/07/2015 12:33        Scheduled  Meds: . aspirin EC  81 mg Oral Daily  . carvedilol  6.25 mg Oral BID WC  . [START ON 09/08/2015] divalproex  500 mg Oral Q12H  . enoxaparin (LOVENOX) injection  40 mg Subcutaneous Q24H  . FLUoxetine  20 mg Oral Daily  . insulin aspart  0-5 Units Subcutaneous QHS  . insulin aspart  0-9 Units Subcutaneous TID WC  . lisinopril  5 mg Oral Daily  . pravastatin  80 mg Oral Daily  . QUEtiapine  25 mg Oral TID  . valproate sodium  1,000 mg Intravenous Once   Continuous Infusions:       Time spent: 5030   Haydee SalterPhillip M Sade Hollon, MD [facility] Pager [phone] If 7PM-7AM, please contact night-coverage [email or web address] Password [email or web address] 09/07/2015, 7:01 PM

## 2015-09-07 NOTE — [place] FL2 (Signed)
[place] MEDICAID FL2 LEVEL OF CARE SCREENING TOOL     IDENTIFICATION  Patient Name: Tamara Shaw Birthdate: [birth year] Sex: female Admission Date (Current Location): 09/04/2015  [facility] and [place] Number:  [employer] and Address:  [facility] [place], [address] [place], [place] [address]      Provider Number: [id number]  Attending Physician Name and Address:  Haydee Salter, MD  Relative Name and Phone Number:  Dorinda Hill, son, [phone]    Current Level of Care: Hospital Recommended Level of Care: Skilled Nursing Facility Prior Approval Number:    Date Approved/Denied:   PASRR Number:    Discharge Plan: SNF    Current Diagnoses: Patient Active Problem List   Diagnosis Date Noted  . Delirium 09/05/2015  . Diabetes mellitus without complication (HCC) 09/05/2015  . Essential hypertension 09/05/2015  . Hypothyroidism 09/05/2015  . Leukocytosis 09/05/2015    Orientation RESPIRATION BLADDER Height & Weight      (Disoriented x4)  Normal Incontinent Weight: 73.982 kg (163 lb 1.6 oz) Height:     BEHAVIORAL SYMPTOMS/MOOD NEUROLOGICAL BOWEL NUTRITION STATUS      Continent Diet (Please see DC Summary)  AMBULATORY STATUS COMMUNICATION OF NEEDS Skin   Extensive Assist Verbally Normal                       Personal Care Assistance Level of Assistance  Bathing, Feeding, Dressing Bathing Assistance: Limited assistance Feeding assistance: Limited assistance Dressing Assistance: Limited assistance     Functional Limitations Info             SPECIAL CARE FACTORS FREQUENCY  PT (By licensed PT), OT (By licensed OT)     PT Frequency: 5x/week OT Frequency: 3x/week            Contractures      Additional Factors Info  Insulin Sliding Scale, Code Status, Allergies, Psychotropic Code Status Info: Full Allergies Info: NKA Psychotropic Info: Seroquel Insulin Sliding Scale Info: insulin aspart (novoLOG) injection 0-5  Units;insulin aspart (novoLOG) injection 0-9 Units       Current Medications (09/07/2015):  This is the current hospital active medication list Current Facility-Administered Medications  Medication Dose Route Frequency Provider Last Rate Last Dose  . acetaminophen (TYLENOL) tablet 650 mg  650 mg Oral Q6H PRN Alberteen Sam, MD   650 mg at 09/05/15 0719   Or  . acetaminophen (TYLENOL) suppository 650 mg  650 mg Rectal Q6H PRN Alberteen Sam, MD      . aspirin EC tablet 81 mg  81 mg Oral Daily Alberteen Sam, MD   81 mg at 09/07/15 0915  . carvedilol (COREG) tablet 6.25 mg  6.25 mg Oral BID WC Alberteen Sam, MD   6.25 mg at 09/07/15 0915  . enoxaparin (LOVENOX) injection 40 mg  40 mg Subcutaneous Q24H Alberteen Sam, MD   40 mg at 09/06/15 1555  . FLUoxetine (PROZAC) capsule 20 mg  20 mg Oral Daily Alberteen Sam, MD   20 mg at 09/07/15 0915  . insulin aspart (novoLOG) injection 0-5 Units  0-5 Units Subcutaneous QHS Alberteen Sam, MD   0 Units at 09/05/15 2139  . insulin aspart (novoLOG) injection 0-9 Units  0-9 Units Subcutaneous TID WC Alberteen Sam, MD   2 Units at 09/07/15 0912  . levothyroxine (SYNTHROID, LEVOTHROID) tablet 75 mcg  75 mcg Oral QAC breakfast Alberteen Sam, MD   75 mcg  at 09/07/15 0917  . lisinopril (PRINIVIL,ZESTRIL) tablet 5 mg  5 mg Oral Daily Alberteen Samhristopher P Danford, MD   5 mg at 09/07/15 0915  . LORazepam (ATIVAN) tablet 0.5 mg  0.5 mg Oral Q6H PRN Haydee SalterPhillip M Hobbs, MD   0.5 mg at 09/06/15 0936  . ondansetron (ZOFRAN) tablet 4 mg  4 mg Oral Q6H PRN Alberteen Samhristopher P Danford, MD       Or  . ondansetron (ZOFRAN) injection 4 mg  4 mg Intravenous Q6H PRN Alberteen Samhristopher P Danford, MD      . pravastatin (PRAVACHOL) tablet 80 mg  80 mg Oral Daily Alberteen Samhristopher P Danford, MD   80 mg at 09/07/15 0915  . QUEtiapine (SEROQUEL) tablet 25 mg  25 mg Oral TID Joseph ArtJessica U Vann, DO   25 mg at 09/07/15 0915  . QUEtiapine (SEROQUEL)  tablet 25 mg  25 mg Oral Q8H PRN Joseph ArtJessica U Vann, DO         Discharge Medications: Please see discharge summary for a list of discharge medications.  Relevant Imaging Results:  Relevant Lab Results:   Additional Information SSN: [id number]  Mykle Pascua S Snyder[place], [place]

## 2015-09-07 NOTE — Progress Notes (Signed)
rn asked pt if she wanted to brush her teeth, pt said yes. Pt given tooth brush, pt started brushing the mirror on the bedside table, seconds later pt started brushing her teeth. rn will get pt new toothbrush.  Pt continually talking about Jesus, [name], and going the movies.

## 2015-09-08 ENCOUNTER — Inpatient Hospital Stay (HOSPITAL_COMMUNITY): Payer: Medicare Other

## 2015-09-08 LAB — GLUCOSE, CAPILLARY
GLUCOSE-CAPILLARY: 278 mg/dL — AB (ref 65–99)
Glucose-Capillary: 156 mg/dL — ABNORMAL HIGH (ref 65–99)
Glucose-Capillary: 176 mg/dL — ABNORMAL HIGH (ref 65–99)
Glucose-Capillary: 133 mg/dL — ABNORMAL HIGH (ref 65–99)

## 2015-09-08 LAB — BASIC METABOLIC PANEL
Anion gap: 10 (ref 5–15)
BUN: 18 mg/dL (ref 6–20)
CALCIUM: 9.4 mg/dL (ref 8.9–10.3)
CO2: 21 mmol/L — ABNORMAL LOW (ref 22–32)
Chloride: 107 mmol/L (ref 101–111)
Creatinine, Ser: 0.85 mg/dL (ref 0.44–1.00)
GFR calc Af Amer: >60 mL/min (ref >60)
GLUCOSE: 175 mg/dL — AB (ref 65–99)
Potassium: 4.4 mmol/L (ref 3.5–5.1)
Sodium: 138 mmol/L (ref 135–145)

## 2015-09-08 LAB — CBC WITH DIFFERENTIAL/PLATELET
Basophils Absolute: 0 10*3/uL (ref 0.0–0.1)
Basophils Relative: 0 %
EOS PCT: 1 %
Eosinophils Absolute: 0.1 10*3/uL (ref 0.0–0.7)
HCT: 40.1 % (ref 36.0–46.0)
Hemoglobin: 13.4 g/dL (ref 12.0–15.0)
LYMPHS ABS: 2.3 10*3/uL (ref 0.7–4.0)
LYMPHS PCT: 23 %
MCH: 31.2 pg (ref 26.0–34.0)
MCHC: 33.4 g/dL (ref 30.0–36.0)
MCV: 93.3 fL (ref 78.0–100.0)
MONO ABS: 1.3 10*3/uL — AB (ref 0.1–1.0)
Monocytes Relative: 13 %
Neutro Abs: 6.3 10*3/uL (ref 1.7–7.7)
Neutrophils Relative %: 63 %
PLATELETS: 250 10*3/uL (ref 150–400)
RBC: 4.3 MIL/uL (ref 3.87–5.11)
RDW: 13.1 % (ref 11.5–15.5)
WBC: 10 10*3/uL (ref 4.0–10.5)

## 2015-09-08 LAB — T4, FREE: Free T4: 1.32 ng/dL — ABNORMAL HIGH (ref 0.61–1.12)

## 2015-09-08 LAB — RPR: RPR Ser Ql: NONREACTIVE

## 2015-09-08 MED ORDER — GADOBENATE DIMEGLUMINE 529 MG/ML IV SOLN
15.0000 mL | Freq: Once | INTRAVENOUS | Status: AC | PRN
  Administered 2015-09-08: 15 mL via INTRAVENOUS

## 2015-09-08 MED ORDER — LORAZEPAM 2 MG/ML IJ SOLN
1.0000 mg | Freq: Once | INTRAMUSCULAR | Status: AC
  Administered 2015-09-08: 1 mg via INTRAVENOUS
  Filled 2015-09-08: qty 1

## 2015-09-08 MED ORDER — LORAZEPAM 2 MG/ML IJ SOLN
1.0000 mg | Freq: Once | INTRAMUSCULAR | Status: AC | PRN
Start: 2015-09-08 — End: 2015-09-09
  Administered 2015-09-09: 1 mg via INTRAVENOUS
  Filled 2015-09-08: qty 1

## 2015-09-08 NOTE — Consult Note (Signed)
[facility] Face-to-Face Psychiatry Consult Progress Note  Reason for Consult: [facility], "[facility] complex" Referring Physician: Dr. Eulogio Bear Patient Identification: Tamara Shaw MRN:  [id number] Principal Diagnosis: Delirium Diagnosis:   Patient Active Problem List   Diagnosis Date Noted  . Elevated WBC count [D72.829]   . Generalized abdominal discomfort [R10.84]   . Delirium [R41.0] 09/05/2015  . Diabetes mellitus without complication ([place]) [F57.3] 09/05/2015  . Essential hypertension [I10] 09/05/2015  . Hypothyroidism [E03.9] 09/05/2015  . Leukocytosis [D72.829] 09/05/2015    Total Time spent with patient: 30 minutes  Subjective:   Tamara Shaw is a 70 y.o. female patient with history of depression, unspecified cognitive disorder, hypothyroidism, diabetes, hypertension who is consulted for hallucinations.   Interval history:   - Patient was strated on Depakote 500 mg BID per neuro recs - pending MRI - Patient took scheduled quetiapine and 25 mg prn this morning - Patient was given Ativan 0.5 mg IV this morning and 1 mg IV around noon - EKG QTc 448 msecon on 7/19 Per nursing report, she had an episode of agitation (yelling) this morning. She has been sleeping most of the day after she was given Ativan.   Patient is interviewed at the bedside.  She is asleep but arousal with mild physical stimulation. She mumbles words and gets back to sleep.    Past Psychiatric History: please see the initial consult note  Risk to Self: Is patient at risk for suicide?: No (pt specifically said she did not want to die or hurt others) Risk to Others:  no Prior Inpatient Therapy:   please see the initial consult note Prior Outpatient Therapy:   please see the initial consult note  Past Medical History:  Past Medical History  Diagnosis Date  . Diabetes mellitus without complication ([place])   . Hypertension   . Depression   . Thyroid disease     hypothyroid  . Dementia    History  reviewed. No pertinent past surgical history. Family History:  Family History  Problem Relation Age of Onset  . Hypertension Mother   . Hypertension Father     Social History:  History  Alcohol Use No     History  Drug Use No    Social History   Social History  . Marital Status: Married    Spouse Name: N/A  . Number of Children: N/A  . Years of Education: N/A   Social History Main Topics  . Smoking status: Never Smoker   . Smokeless tobacco: None  . Alcohol Use: No  . Drug Use: No  . Sexual Activity: Not Asked   Other Topics Concern  . None   Social History Narrative   Additional Social History:  no update  Allergies:  No Known Allergies  Labs:  Results for orders placed or performed during the hospital encounter of 09/04/15 (from the past 48 hour(s))  Glucose, capillary     Status: Abnormal   Collection Time: 09/06/15 11:36 AM  Result Value Ref Range   Glucose-Capillary 212 (H) 65 - 99 mg/dL  TSH     Status: Abnormal   Collection Time: 09/06/15 12:02 PM  Result Value Ref Range   TSH 0.042 (L) 0.350 - 4.500 uIU/mL  Glucose, capillary     Status: Abnormal   Collection Time: 09/06/15  4:34 PM  Result Value Ref Range   Glucose-Capillary 204 (H) 65 - 99 mg/dL  Glucose, capillary     Status: Abnormal   Collection Time: 09/06/15  8:58 PM  Result Value Ref Range   Glucose-Capillary 194 (H) 65 - 99 mg/dL   Comment 1 Notify RN    Comment 2 Document in Chart   Renal function panel     Status: Abnormal   Collection Time: 09/07/15  5:44 AM  Result Value Ref Range   Sodium 139 135 - 145 mmol/L   Potassium 3.6 3.5 - 5.1 mmol/L   Chloride 106 101 - 111 mmol/L   CO2 25 22 - 32 mmol/L   Glucose, Bld 162 (H) 65 - 99 mg/dL   BUN 16 6 - 20 mg/dL   Creatinine, Ser 0.82 0.44 - 1.00 mg/dL   Calcium 9.8 8.9 - 10.3 mg/dL   Phosphorus 3.4 2.5 - 4.6 mg/dL   Albumin 3.6 3.5 - 5.0 g/dL   GFR calc non Af Amer >60 >60 mL/min   GFR calc Af Amer >60 >60 mL/min    Comment:  (NOTE) The eGFR has been calculated using the CKD EPI equation. This calculation has not been validated in all clinical situations. eGFR's persistently <60 mL/min signify possible Chronic Kidney Disease.    Anion gap 8 5 - 15  Glucose, capillary     Status: Abnormal   Collection Time: 09/07/15  6:31 AM  Result Value Ref Range   Glucose-Capillary 178 (H) 65 - 99 mg/dL   Comment 1 Notify RN    Comment 2 Document in Chart   Glucose, capillary     Status: Abnormal   Collection Time: 09/07/15 11:53 AM  Result Value Ref Range   Glucose-Capillary 169 (H) 65 - 99 mg/dL   Comment 1 Notify RN    Comment 2 Document in Chart   Glucose, capillary     Status: Abnormal   Collection Time: 09/07/15  4:17 PM  Result Value Ref Range   Glucose-Capillary 208 (H) 65 - 99 mg/dL   Comment 1 Notify RN    Comment 2 Document in Chart   RPR     Status: None   Collection Time: 09/07/15  8:10 PM  Result Value Ref Range   RPR Ser Ql Non Reactive Non Reactive    Comment: (NOTE) Performed At: [facility] [address] [place], [place] [id number] Lindon Romp MD [id number]   T4, free     Status: Abnormal   Collection Time: 09/07/15  8:10 PM  Result Value Ref Range   Free T4 1.32 (H) 0.61 - 1.12 ng/dL    Comment: (NOTE) Biotin ingestion may interfere with free T4 tests. If the results are inconsistent with the TSH level, previous test results, or the clinical presentation, then consider biotin interference. If needed, order repeat testing after stopping biotin.   Culture, blood (Routine X 2) w Reflex to ID Panel     Status: None (Preliminary result)   Collection Time: 09/07/15  8:10 PM  Result Value Ref Range   Specimen Description BLOOD LEFT ANTECUBITAL    Special Requests IN PEDIATRIC BOTTLE 3CC    Culture PENDING    Report Status PENDING   Ammonia     Status: None   Collection Time: 09/07/15  8:10 PM  Result Value Ref Range   Ammonia 33 9 - 35 umol/L  Culture, blood (Routine X  2) w Reflex to ID Panel     Status: None (Preliminary result)   Collection Time: 09/07/15  8:25 PM  Result Value Ref Range   Specimen Description BLOOD LEFT HAND    Special Requests BOTTLES DRAWN AEROBIC  ONLY 5CC    Culture PENDING    Report Status PENDING   Glucose, capillary     Status: Abnormal   Collection Time: 09/07/15 10:05 PM  Result Value Ref Range   Glucose-Capillary 219 (H) 65 - 99 mg/dL  Glucose, capillary     Status: Abnormal   Collection Time: 09/08/15  6:34 AM  Result Value Ref Range   Glucose-Capillary 156 (H) 65 - 99 mg/dL    Current Facility-Administered Medications  Medication Dose Route Frequency Provider Last Rate Last Dose  . acetaminophen (TYLENOL) tablet 650 mg  650 mg Oral Q6H PRN Edwin Dada, MD   650 mg at 09/05/15 0719   Or  . acetaminophen (TYLENOL) suppository 650 mg  650 mg Rectal Q6H PRN Edwin Dada, MD      . aspirin EC tablet 81 mg  81 mg Oral Daily Edwin Dada, MD   81 mg at 09/07/15 0915  . carvedilol (COREG) tablet 6.25 mg  6.25 mg Oral BID WC Edwin Dada, MD   6.25 mg at 09/08/15 0857  . divalproex (DEPAKOTE) DR tablet 500 mg  500 mg Oral Q12H Greta Doom, MD      . enoxaparin (LOVENOX) injection 40 mg  40 mg Subcutaneous Q24H Edwin Dada, MD   40 mg at 09/07/15 1510  . FLUoxetine (PROZAC) capsule 20 mg  20 mg Oral Daily Edwin Dada, MD   20 mg at 09/07/15 0915  . insulin aspart (novoLOG) injection 0-5 Units  0-5 Units Subcutaneous QHS Edwin Dada, MD   2 Units at 09/07/15 2246  . insulin aspart (novoLOG) injection 0-9 Units  0-9 Units Subcutaneous TID WC Edwin Dada, MD   2 Units at 09/08/15 0857  . lisinopril (PRINIVIL,ZESTRIL) tablet 5 mg  5 mg Oral Daily Edwin Dada, MD   5 mg at 09/07/15 0915  . LORazepam (ATIVAN) tablet 0.5 mg  0.5 mg Oral Q6H PRN Elwin Mocha, MD   0.5 mg at 09/08/15 0106  . ondansetron (ZOFRAN) tablet 4 mg  4 mg Oral Q6H  PRN Edwin Dada, MD       Or  . ondansetron (ZOFRAN) injection 4 mg  4 mg Intravenous Q6H PRN Edwin Dada, MD      . pravastatin (PRAVACHOL) tablet 80 mg  80 mg Oral Daily Edwin Dada, MD   80 mg at 09/07/15 0915  . QUEtiapine (SEROQUEL) tablet 25 mg  25 mg Oral TID Geradine Girt, DO   25 mg at 09/07/15 2245  . QUEtiapine (SEROQUEL) tablet 25 mg  25 mg Oral Q8H PRN Geradine Girt, DO   25 mg at 09/08/15 0106    Musculoskeletal: Strength & Muscle Tone: cogwheel (slight bilateral rigidity) Gait & Station: unable to assess due to current mental status Patient leans: N/A  Psychiatric Specialty Exam: Physical Exam  Neurological:  Moves four extremities, cogwheel rigidity on bilateral upper extremity    Review of Systems  Unable to perform ROS: mental acuity    Blood pressure 97/70, pulse 74, temperature 98.8 F (37.1 C), temperature source Oral, resp. rate 18, weight 163 lb 1.6 oz (73.982 kg), SpO2 95 %.There is no height on file to calculate BMI.  General Appearance: Disheveled  Eye Contact:  Poor  Speech:  Slurred  Volume:  Decreased  Mood:  unable to assess due to current medical condition  Affect:  Labile  Thought Process:  Disorganized  Orientation:  Other:  unable to assess due to current mental state  Thought Content:  unable to assess due to current mental state  Suicidal Thoughts:  unable to assess due to current mental state  Homicidal Thoughts:  unable to assess due to current mental state  Memory:  unable to assess due to current mental state  Judgement:  Poor  Insight:  Lacking  Psychomotor Activity:  Increased  Concentration:  Concentration: Poor and Attention Span: Poor  Recall:  unable to assess due to current mental state  Fund of Knowledge:  Poor  Language:  Poor  Akathisia:  No  Handed:    AIMS (if indicated):     Assets:  Others:  supportive family  ADL's:  Impaired  Cognition:  Impaired,  Moderate  Sleep:       Assessment Ms. Simko is a 70 year old patient with history of depression, unspecified cognitive disorder, hypothyroidism, diabetes, hypertension with worsening falls over the couple of months with recent admission to [facility] (the records are not available but she may have had a UTI and normal brain imaging), who was admitted on 7/17 for delirium. Psychiatry is consulted for hallucinations. We have started scheduled quetiapine given her ongoing hallucinations secondary to delirium.   # Delirium She continues to have behavioral issues and impaired alertness/attention with marked disorganization and VH. Agree with continued medical work up for her sustained altered mental status, although it is not uncommon clinical course of prolonged delirium. She might also have underlying dementia as described below, which has risk for developing delirium. She has cogwheel rigidity on today's exam; unfortunately will not be able to increase her quetiapine dose at this time for behavioral issues/hallucinations. Would recommend quetiapine prn for agitation/behavioral issues and avoid using lorazepam given it is deliriogenic. Would recommend behavioral precautions as below.Noted that it is less likely that she has underlying psychotic disorder given its onset for people without psychiatry diagnosis.   # r/o unspecified cognitive disorder # r/o Lewy body dementia Per collaterals, Ms. Edge had independent IADL/ADL until this April with limited memory loss. It is unclear whether preceding episode of cognitive decline over few months is secondary to delirium/medical condition of UTI or she has started to have some underlying cognitive disorder. Subtle decline might had been missed as well as she lives by herself. She has some features of Lewy body dementia given her AH/VH and appears to be susceptible to antipsychotics (cogwheel rigidity on quetiapine); this needs to be in differential with use of  antipsychotics, which can exacerbate her symptoms. Appreciate neurology work up to rule out treatable cause of dementia.  Recommendations - Continue quetiapine 25 mg TID for hallucinations/insomnia. - Continue quetiapine 25 mg PO q8h prn for agitation.  - Discontinue Ativan prn -Defer the use of Depakote to neurology. Consider monitor LFT/ammonia level if appropriate - Continue to hold Xanax - Continue to monitor and treat underlying medical causes of delirium, including infection, electrolyte disturbances, etc. - Delirium precautions - Minimize/avoid deliriogenic meds including: anticholinergic, opiates, benzodiazepines  - Maintain hydration, oxygenation, nutrition  - Limit use of restraints and catheters  - Normalize sleep patterns by minimizing nighttime noise, light and interruptions by   clustering care, opening blinds during the day  - Reorient the patient frequently, provide easily visible clock and calendar  - Provide sensory aids like glasses, hearing aids  - Encourage ambulation, regular activities and visitors to maintain cognitive stimulation   Treatment Plan Summary: Plan as above  Disposition: Patient does not meet  criteria for psychiatric inpatient admission.  Thank you for your consult. We will sign off. Please page psychiatry consult if any questions or concerns.   Norman Clay, MD 09/08/2015 9:41 AM

## 2015-09-08 NOTE — Progress Notes (Signed)
Physical Therapy Treatment Patient Details Name: Tamara Shaw MRN: [id number] DOB: 1945/05/10 Today's Date: 09/08/2015    History of Present Illness Tamara Shaw is a 70 y.o. female patient with history of depression, unspecified cognitive disorder, hypothyroidism, diabetes, hypertension who is consulted for hallucinations.     PT Comments    Pt needing guard assist at minimum on toilet with min for ambulation and mod for bed mobility.  She needs frequent redirection, but otherwise was easy to manage.  Follow Up Recommendations  SNF     Equipment Recommendations  Other (comment)    Recommendations for Other Services       Precautions / Restrictions Precautions Precautions: Fall    Mobility  Bed Mobility Overal bed mobility: Needs Assistance Bed Mobility: Rolling;Sidelying to Sit Rolling: Mod assist Sidelying to sit: Mod assist       General bed mobility comments: max cues and assist side to sit  Transfers Overall transfer level: Needs assistance Equipment used: 1 person hand held assist Transfers: Sit to/from Stand Sit to Stand: Min assist            Ambulation/Gait Ambulation/Gait assistance: Min assist Ambulation Distance (Feet): 15 Feet (to bathroom and then 90 feet in hall) Assistive device: 1 person hand held assist Gait Pattern/deviations: Decreased stride length     General Gait Details: hold assist for guidance and stability. Pt still not able to stay focused on task and needs frequent redirection   Stairs            Wheelchair Mobility    Modified Rankin (Stroke Patients Only)       Balance Overall balance assessment: Needs assistance Sitting-balance support: Feet supported;No upper extremity supported Sitting balance-Leahy Scale: Fair (steady on toilet)       Standing balance-Leahy Scale: Poor                      Cognition Arousal/Alertness: Lethargic;Awake/alert Behavior During Therapy:  Restless Overall Cognitive Status: Impaired/Different from baseline Area of Impairment: Orientation;Following commands;Safety/judgement;Attention Orientation Level: Disoriented to;Person;Place;Time;Situation Current Attention Level: Sustained Memory: Decreased recall of precautions;Decreased short-term memory Following Commands: Follows one step commands with increased time;Follows one step commands inconsistently Safety/Judgement: Decreased awareness of deficits;Decreased awareness of safety     General Comments: Speech peppered with nonsensical words and words out of context except she asked if she was closed up in the back,    Exercises      General Comments        Pertinent Vitals/Pain Pain Assessment: Faces Faces Pain Scale: No hurt    Home Living                      Prior Function            PT Goals (current goals can now be found in the care plan section) Acute Rehab PT Goals Patient Stated Goal: none stated PT Goal Formulation: Patient unable to participate in goal setting Time For Goal Achievement: 09/13/15 Potential to Achieve Goals: Fair Progress towards PT goals: Progressing toward goals    Frequency  Min 3X/week    PT Plan Current plan remains appropriate    Co-evaluation             End of Session   Activity Tolerance: Patient tolerated treatment well Patient left: in chair;with chair alarm set;with call bell/phone within reach;with nursing/sitter in room     Time: 4098-1191 PT Time Calculation (min) (ACUTE ONLY): 22 min  Charges:  $Gait Training: 8-22 mins                    G Codes:      Chantele Corado V 09/08/2015, 4:59 PM  7/21/201Eliseo Gum7  [place] BingKen Tiaria Biby, PT [phone]  (pager)

## 2015-09-08 NOTE — Progress Notes (Signed)
Inpatient Diabetes Program Recommendations  AACE/ADA: New Consensus Statement on Inpatient Glycemic Control (2015)  Target Ranges:  Prepandial:   less than 140 mg/dL      Peak postprandial:   less than 180 mg/dL (1-2 hours)      Critically ill patients:  140 - 180 mg/dL   Results for Tamara LollingGRIMSLEY, Tamara Shaw (MRN [id number]) as of 09/08/2015 08:17  Ref. Range 09/07/2015 06:31 09/07/2015 11:53 09/07/2015 16:17 09/07/2015 22:05 09/08/2015 06:34  Glucose-Capillary Latest Ref Range: 65-99 mg/dL 086178 (H) 578169 (H) 469208 (H) 219 (H) 156 (H)   Review of Glycemic Control  Diabetes history: DM2 Outpatient Diabetes medications: Metformin 500 mg BID Current orders for Inpatient glycemic control: Novolog 0-9 units TID with meals, Novolog 0-5 units QHS  Inpatient Diabetes Program Recommendations: Correction (SSI): Please consider increasing Novolog correction scale to moderate.  Thanks, Orlando PennerMarie Reason Helzer, RN, MSN, CDE Diabetes Coordinator Inpatient Diabetes Program [phone] (Team Pager from 8am to 5pm) [phone] ([facility] office) [phone] [facility] office) [phone] [facility] office)

## 2015-09-08 NOTE — Progress Notes (Signed)
Patient transported to MRI 

## 2015-09-08 NOTE — Procedures (Signed)
Indication: acute onset Dementia/hallucinations  Risks of the procedure were dicussed with the patient's son including post-LP headache, bleeding, infection, weakness/numbness of legs(radiculopathy), death.  The patient's proxy agreed and written consent was obtained.   The patient was prepped and draped, and using sterile technique a 20 gauge quinke spinal needle was inserted in the L3/4 space but was not able to reach CSF due to multiple bony prominances. LP will need to be obtain by IR under Fluoroscopy.  Felicie MornDavid Arnet Hofferber PA-C [facility] [phone]  09/08/2015, 12:44 PM

## 2015-09-08 NOTE — Care Management Important Message (Signed)
Important Message  Patient Details  Name: Tamara LollingDeanna P Shaw MRN: [id number] Date of Birth: 1945-09-01   Medicare Important Message Given:  Yes    Bernadette HoitShoffner, Quyen Cutsforth Coleman 09/08/2015, 8:22 AM

## 2015-09-08 NOTE — Progress Notes (Signed)
PROGRESS NOTE    SIBLEY ROLISON  ZOX:[id number] DOB: October 06, 1945 DOA: 09/04/2015 PCP: Bjorn Loser, DO    Brief Narrative:  Pt still confused neuro and psych consulted. Waiting to see if this is drug induced delerium. Holding benzo and synthroid.  Today's plan - get MRI  Assessment & Plan:   Principal Problem:   Delirium Active Problems:   Diabetes mellitus without complication (HCC)   Essential hypertension   Hypothyroidism   Leukocytosis   Elevated WBC count   Generalized abdominal discomfort   Assessment/Plan 1. Delirium:  The patient appears to have waxing and waning cognition and attention. There is a likelihood that she has some underlying dementia, and she is currently tapered to a lower dose of alprazolam and starting quetiapine, which could contribute to delirium. No fever or tachycardia, so blood cultures deferred. -Neurology consult, recommendations appreciated -EEG is ordered -MRI brain is ordered - pending, pt behavior makes it difficult, anesthesia? - will do seroquel per psych recs - CBC in AM - blood cult pending - Neuro attempted LP, pt sedated from ativan, will try to get MRI  2. Diabetes:  -Holding home metformin -Sliding scale corrections as needed  3. HTN:  -Continue home carvedilol, lisinopril, aspirin, statin  4. Hypothyoidism:  -Check TSH and is low 0.042 (hyperthyroid), free T4 elevated at 1.32 - cont holding home levothyroxine  5. Leukocytosis:  Etiology unclear. -Repeat CBC in AM shows resolution, will check inflammatory markers - CXR neg, UA neg, Abd XR neg  6. Anxiety or depression: -Continue home fluoxetine -Hold home alprazolam -Hold other psychoactive medicines for now (new quetiapine) - psych has signed of stating not likely psych etiology given sudden onset   DVT prophylaxis:lovenox Code Status: full Family Communication: unavailable Disposition Plan: TBD   Consultants:   Psych,  Neuro  Procedures:  n/a  Antimicrobials:  n/A  Subjective: Pt states tired. Denies pain. Denies fever.  Objective: Filed Vitals:   09/07/15 1807 09/07/15 2206 09/08/15 0217 09/08/15 0636  BP: 123/87 123/70 97/64 97/70   Pulse: 79 74 76 74  Temp: 98.5 F (36.9 C) 98.7 F (37.1 C) 98.2 F (36.8 C) 98.8 F (37.1 C)  TempSrc: Oral Oral Oral Oral  Resp: 18 17 15 18   Weight:      SpO2: 100% 94% 97% 95%   No intake or output data in the 24 hours ending 09/08/15 0920 Filed Weights   09/05/15 0548  Weight: 73.982 kg (163 lb 1.6 oz)    Examination:  General exam: Appears calm and comfortable, tired Respiratory system: Clear to auscultation. Respiratory effort normal. Cardiovascular system: S1 & S2 heard, RRR. No JVD, murmurs, rubs, gallops or clicks. No pedal edema. Gastrointestinal system: Abdomen is nondistended, soft and With generalized tenderness . No organomegaly or masses felt. Normal bowel sounds heard. Central nervous system: Alert and oriented. No focal neurological deficits. Extremities: Symmetric 5 x 5 power. Skin: No rashes, lesions or ulcers Psychiatry: Judgement and insight appear normal. Mood & affect appropriate.     Data Reviewed: I have personally reviewed following labs and imaging studies  CBC:  Recent Labs Lab 09/05/15 0218 09/08/15 0943  WBC 12.0* 10.0  NEUTROABS 6.9 6.3  HGB 13.0 13.4  HCT 39.0 40.1  MCV 93.5 93.3  PLT 275 250   Basic Metabolic Panel:  Recent Labs Lab 09/05/15 0218 09/06/15 0344 09/07/15 0544  NA 142  --  139  K 3.8  --  3.6  CL 109  --  106  CO2 26  --  25  GLUCOSE 163*  --  162*  BUN 28*  --  16  CREATININE 0.87 0.76 0.82  CALCIUM 9.8  --  9.8  PHOS  --   --  3.4   GFR: CrCl cannot be calculated (Unknown ideal weight.). Liver Function Tests:  Recent Labs Lab 09/05/15 0218 09/07/15 0544  AST 19  --   ALT 15  --   ALKPHOS 82  --   BILITOT 0.6  --   PROT 7.4  --   ALBUMIN 4.1 3.6   No results for  input(s): LIPASE, AMYLASE in the last 168 hours.  Recent Labs Lab 09/07/15 2010  AMMONIA 33   Coagulation Profile: No results for input(s): INR, PROTIME in the last 168 hours. Cardiac Enzymes: No results for input(s): CKTOTAL, CKMB, CKMBINDEX, TROPONINI in the last 168 hours. BNP (last 3 results) No results for input(s): PROBNP in the last 8760 hours. HbA1C: No results for input(s): HGBA1C in the last 72 hours. CBG:  Recent Labs Lab 09/07/15 0631 09/07/15 1153 09/07/15 1617 09/07/15 2205 09/08/15 0634  GLUCAP 178* 169* 208* 219* 156*   Lipid Profile: No results for input(s): CHOL, HDL, LDLCALC, TRIG, CHOLHDL, LDLDIRECT in the last 72 hours. Thyroid Function Tests:  Recent Labs  09/06/15 1202 09/07/15 2010  TSH 0.042*  --   FREET4  --  1.32*   Anemia Panel:  Recent Labs  09/05/15 2030  VITAMINB12 1018*   Sepsis Labs: No results for input(s): PROCALCITON, LATICACIDVEN in the last 168 hours.  Recent Results (from the past 240 hour(s))  Culture, blood (Routine X 2) w Reflex to ID Panel     Status: None (Preliminary result)   Collection Time: 09/07/15  8:10 PM  Result Value Ref Range Status   Specimen Description BLOOD LEFT ANTECUBITAL  Final   Special Requests IN PEDIATRIC BOTTLE 3CC  Final   Culture PENDING  Incomplete   Report Status PENDING  Incomplete  Culture, blood (Routine X 2) w Reflex to ID Panel     Status: None (Preliminary result)   Collection Time: 09/07/15  8:25 PM  Result Value Ref Range Status   Specimen Description BLOOD LEFT HAND  Final   Special Requests BOTTLES DRAWN AEROBIC ONLY 5CC  Final   Culture PENDING  Incomplete   Report Status PENDING  Incomplete         Radiology Studies: Dg Chest Port 1 View  09/06/2015  CLINICAL DATA:  Leukocytosis EXAM: PORTABLE CHEST 1 VIEW COMPARISON:  None. FINDINGS: Surgical hardware is partially visualized overlying the lower cervical spine. Normal heart size. Atherosclerotic aortic arch.  Otherwise normal mediastinal contour. No pneumothorax. No pleural effusion. Lungs appear clear, with no acute consolidative airspace disease and no pulmonary edema. IMPRESSION: No active disease. Electronically Signed   By: Delbert PhenixJason A Poff M.D.   On: 09/06/2015 12:57   Dg Abd 2 Views  09/07/2015  CLINICAL DATA:  Lower abdominal pain comment nausea, vomiting and diarrhea. No time course given. EXAM: ABDOMEN - 2 VIEW COMPARISON:  None. FINDINGS: The lung bases are grossly clear. There is moderate stool throughout the colon and down into the rectum. Scattered air-filled small bowel loops without distention or air-fluid levels to suggest obstruction. No free air. The soft tissue shadows are maintained. Lumbar fusion hardware noted. Extensive vascular calcifications with this iliac artery stents. IMPRESSION: No plain film findings for an acute abdominal process. No findings for obstruction or perforation. Electronically Signed   By: Demetrius CharityP.  Gallerani M.D.   On: 09/07/2015 12:33        Scheduled Meds: . aspirin EC  81 mg Oral Daily  . carvedilol  6.25 mg Oral BID WC  . divalproex  500 mg Oral Q12H  . enoxaparin (LOVENOX) injection  40 mg Subcutaneous Q24H  . FLUoxetine  20 mg Oral Daily  . insulin aspart  0-5 Units Subcutaneous QHS  . insulin aspart  0-9 Units Subcutaneous TID WC  . lisinopril  5 mg Oral Daily  . pravastatin  80 mg Oral Daily  . QUEtiapine  25 mg Oral TID   Continuous Infusions:    LOS: 1 day    Time spent: 52   Haydee Salter, MD [facility] Pager [phone] If 7PM-7AM, please contact night-coverage [email or web address] Password [email or web address] 09/08/2015, 9:20 AM

## 2015-09-08 NOTE — Progress Notes (Signed)
Subjective: [place]  States she is ready to go home.   I spoke at length with her son today. He states that though she did have "memory problems" for many years, in fact this was her baseline and he just felt like she was flighty for a long time. She did not have actual new problems until April of this year. He noticed that she was acting funny on the phone and went to visit her, when he noticed that she had multiple dents on her car on all sides indicating that she had had multiple things but she was unable to tell him how any of these it gotten there. He thought it was her medications and took her to her doctor's appointment and made sure she was taking them appropriately, however she continued to have problems at home. He did not live with her, but she was living with her daughter and apparently had been getting progressively worse. It is unclear to me, however, that she had any hallucinations prior to mid May. He states that she did hit her head around that time, and since then she has been getting progressively worse. Prior to April, however, it is unclear that she had any clear deficits.  She has not been able to cooperate for MRI here, however she did have an MRI at an outside institution which reportedly was normal.   Exam: Filed Vitals:   09/08/15 1000 09/08/15 1645  BP: 102/69 120/64  Pulse: 73 68  Temp: 98.2 F (36.8 C) 97.8 F (36.6 C)  Resp: 20 16   Gen: In bed, NAD MS: awake, alert, oriented to person only, having hallucinations, She is very disorganized in her thinking, putting her drink on top of her food and smashing it. 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 Possibilities include CJD, autoimmune disease, Lewy body dementia(which was unnoticed due to the patient living alone for quite some time). She will need a lumbar puncture and will  likely need a sedated MRI.   LP was attempted at the bedside today, and unfortunately was unsuccessful, we'll place order for her to be done under fluoroscopy.  Recommendations: 1) if no benefit from Depakote by tomorrow, would favor discontinuing this.  2) RPR, AM cortisol 3) lumbar puncture with cells, protein, glucose, autoimmune dementia panel, IgG index. 4) if there is evidence of inflammation by pleocytosis or elevated IgG index, there may need to be consideration of empiric steroid treatment. The autoimmune dementia profile will take quite some time to come back. 5) B1  Ritta SlotMcNeill Manha Amato, MD [facility] [phone]  If 7pm- 7am, please page neurology on call as listed in AMION.  09/08/2015, 8:02 PM

## 2015-09-09 DIAGNOSIS — E119 Type 2 diabetes mellitus without complications: Secondary | ICD-10-CM

## 2015-09-09 DIAGNOSIS — I10 Essential (primary) hypertension: Secondary | ICD-10-CM

## 2015-09-09 LAB — GLUCOSE, CAPILLARY
Glucose-Capillary: 127 mg/dL — ABNORMAL HIGH (ref 65–99)
Glucose-Capillary: 196 mg/dL — ABNORMAL HIGH (ref 65–99)
Glucose-Capillary: 229 mg/dL — ABNORMAL HIGH (ref 65–99)
Glucose-Capillary: 243 mg/dL — ABNORMAL HIGH (ref 65–99)

## 2015-09-09 LAB — CBC WITH DIFFERENTIAL/PLATELET
BASOS ABS: 0 10*3/uL (ref 0.0–0.1)
BASOS PCT: 0 %
EOS PCT: 3 %
Eosinophils Absolute: 0.3 10*3/uL (ref 0.0–0.7)
HEMATOCRIT: 38.1 % (ref 36.0–46.0)
Hemoglobin: 12.6 g/dL (ref 12.0–15.0)
Lymphocytes Relative: 30 %
Lymphs Abs: 2.8 10*3/uL (ref 0.7–4.0)
MCH: 31.2 pg (ref 26.0–34.0)
MCHC: 33.1 g/dL (ref 30.0–36.0)
MCV: 94.3 fL (ref 78.0–100.0)
MONO ABS: 0.9 10*3/uL (ref 0.1–1.0)
Monocytes Relative: 10 %
NEUTROS ABS: 5.4 10*3/uL (ref 1.7–7.7)
Neutrophils Relative %: 57 %
PLATELETS: 264 10*3/uL (ref 150–400)
RBC: 4.04 MIL/uL (ref 3.87–5.11)
RDW: 13 % (ref 11.5–15.5)
WBC: 9.4 10*3/uL (ref 4.0–10.5)

## 2015-09-09 LAB — BASIC METABOLIC PANEL
ANION GAP: 8 (ref 5–15)
BUN: 28 mg/dL — ABNORMAL HIGH (ref 6–20)
CALCIUM: 9 mg/dL (ref 8.9–10.3)
CO2: 25 mmol/L (ref 22–32)
Chloride: 105 mmol/L (ref 101–111)
Creatinine, Ser: 1.23 mg/dL — ABNORMAL HIGH (ref 0.44–1.00)
GFR calc Af Amer: 50 mL/min — ABNORMAL LOW (ref >60)
GFR, EST NON AFRICAN AMERICAN: 43 mL/min — AB (ref >60)
GLUCOSE: 259 mg/dL — AB (ref 65–99)
Potassium: 3.9 mmol/L (ref 3.5–5.1)
SODIUM: 138 mmol/L (ref 135–145)

## 2015-09-09 LAB — SEDIMENTATION RATE: SED RATE: 39 mm/h — AB (ref 0–22)

## 2015-09-09 LAB — CORTISOL-AM, BLOOD: Cortisol - AM: 11.8 ug/dL (ref 6.7–22.6)

## 2015-09-09 LAB — C-REACTIVE PROTEIN: CRP: 3.4 mg/dL — AB (ref <1.0)

## 2015-09-09 NOTE — Progress Notes (Signed)
PROGRESS NOTE    Tamara Shaw  TOI:[id number] DOB: 06-Nov-1945 DOA: 09/04/2015 PCP: Bjorn Loser, DO    Brief Narrative:  Pt still confused neuro and psych consulted. Waiting to see if this is drug induced delerium. Holding benzo and synthroid.  Today's plan - no LP for now, update family, prepare for likely Monday d/c to SNF  Assessment & Plan:   Principal Problem:   Delirium Active Problems:   Diabetes mellitus without complication (HCC)   Essential hypertension   Hypothyroidism   Leukocytosis   Elevated WBC count   Generalized abdominal discomfort   Assessment/Plan 1. Delirium:  The patient appears to have waxing and waning cognition and attention. There is a likelihood that she has some underlying dementia, and she is currently tapered to a lower dose of alprazolam and starting quetiapine, which could contribute to delirium. No fever or tachycardia, so blood cultures deferred. -Neurology consult, recommendations appreciated -EEG no seizure acitivty -MRI brain is ordered - neg - will do seroquel per psych recs - CBC in AM - blood cult no growth so far  2. Diabetes:  -Holding home metformin -Sliding scale corrections as needed  3. HTN:  -Continue home carvedilol, lisinopril, aspirin, statin  4. Hypothyoidism:  -Check TSH and is low 0.042 (hyperthyroid), free T4 elevated at 1.32 - cont holding home levothyroxine  5. Leukocytosis:  Etiology unclear. -Repeat CBC in AM shows resolution, elevated inflammatory markers? - CXR neg, UA neg, Abd XR neg  6. Anxiety or depression: -Continue home fluoxetine -Hold home alprazolam -Hold other psychoactive medicines for now (new quetiapine) - psych has signed of stating not likely psych etiology given sudden onset   DVT prophylaxis:lovenox Code Status: full Family Communication: unavailable Disposition Plan: TBD   Consultants:   Psych, Neuro  Procedures:  n/a  Antimicrobials:   n/A  Subjective: Pt states tired. Denies pain. Denies fever. Per nursing pt more calm and no hallucinations that are religious.  Objective: Filed Vitals:   09/09/15 0528 09/09/15 1015 09/09/15 1330 09/09/15 1829  BP: 109/18 103/72 102/75 92/52  Pulse: 57 74 68 71  Temp: 97.5 F (36.4 C) 98.9 F (37.2 C) 98.2 F (36.8 C) 98.5 F (36.9 C)  TempSrc: Oral Axillary Axillary Axillary  Resp: 16 18 18 18   Weight:      SpO2: 96% 97% 98% 100%    Intake/Output Summary (Last 24 hours) at 09/09/15 1851 Last data filed at 09/09/15 1300  Gross per 24 hour  Intake    360 ml  Output      0 ml  Net    360 ml   Filed Weights   09/05/15 0548  Weight: 73.982 kg (163 lb 1.6 oz)    Examination:  General exam: Appears calm and comfortable, tired Respiratory system: Clear to auscultation. Respiratory effort normal. Cardiovascular system: S1 & S2 heard, RRR. No JVD, murmurs, rubs, gallops or clicks. No pedal edema. Gastrointestinal system: Abdomen is nondistended, soft and With generalized tenderness . No organomegaly or masses felt. Normal bowel sounds heard. Central nervous system: Alert and oriented. No focal neurological deficits. Extremities: Symmetric 5 x 5 power. Skin: No rashes, lesions or ulcers Psychiatry/Neuro: Calm, A&Ox2    Data Reviewed: I have personally reviewed following labs and imaging studies  CBC:  Recent Labs Lab 09/05/15 0218 09/08/15 0943 09/09/15 1347  WBC 12.0* 10.0 9.4  NEUTROABS 6.9 6.3 5.4  HGB 13.0 13.4 12.6  HCT 39.0 40.1 38.1  MCV 93.5 93.3 94.3  PLT 275  250 264   Basic Metabolic Panel:  Recent Labs Lab 09/05/15 0218 09/06/15 0344 09/07/15 0544 09-22-2015 0943 09/09/15 1347  NA 142  --  139 138 138  K 3.8  --  3.6 4.4 3.9  CL 109  --  106 107 105  CO2 26  --  25 21* 25  GLUCOSE 163*  --  162* 175* 259*  BUN 28*  --  16 18 28*  CREATININE 0.87 0.76 0.82 0.85 1.23*  CALCIUM 9.8  --  9.8 9.4 9.0  PHOS  --   --  3.4  --   --     GFR: CrCl cannot be calculated (Unknown ideal weight.). Liver Function Tests:  Recent Labs Lab 09/05/15 0218 09/07/15 0544  AST 19  --   ALT 15  --   ALKPHOS 82  --   BILITOT 0.6  --   PROT 7.4  --   ALBUMIN 4.1 3.6   No results for input(s): LIPASE, AMYLASE in the last 168 hours.  Recent Labs Lab 09/07/15 2010  AMMONIA 33   Coagulation Profile: No results for input(s): INR, PROTIME in the last 168 hours. Cardiac Enzymes: No results for input(s): CKTOTAL, CKMB, CKMBINDEX, TROPONINI in the last 168 hours. BNP (last 3 results) No results for input(s): PROBNP in the last 8760 hours. HbA1C: No results for input(s): HGBA1C in the last 72 hours. CBG:  Recent Labs Lab 09-22-2015 1624 09-22-15 2237 09/09/15 0631 09/09/15 1116 09/09/15 1604  GLUCAP 176* 133* 127* 196* 229*   Lipid Profile: No results for input(s): CHOL, HDL, LDLCALC, TRIG, CHOLHDL, LDLDIRECT in the last 72 hours. Thyroid Function Tests:  Recent Labs  09/07/15 2010  FREET4 1.32*   Anemia Panel: No results for input(s): VITAMINB12, FOLATE, FERRITIN, TIBC, IRON, RETICCTPCT in the last 72 hours. Sepsis Labs: No results for input(s): PROCALCITON, LATICACIDVEN in the last 168 hours.  Recent Results (from the past 240 hour(s))  Culture, blood (Routine X 2) w Reflex to ID Panel     Status: None (Preliminary result)   Collection Time: 09/07/15  8:10 PM  Result Value Ref Range Status   Specimen Description BLOOD LEFT ANTECUBITAL  Final   Special Requests IN PEDIATRIC BOTTLE 3CC  Final   Culture NO GROWTH 2 DAYS  Final   Report Status PENDING  Incomplete  Culture, blood (Routine X 2) w Reflex to ID Panel     Status: None (Preliminary result)   Collection Time: 09/07/15  8:25 PM  Result Value Ref Range Status   Specimen Description BLOOD LEFT HAND  Final   Special Requests BOTTLES DRAWN AEROBIC ONLY 5CC  Final   Culture NO GROWTH 2 DAYS  Final   Report Status PENDING  Incomplete          Radiology Studies: Mr Lodema Pilot Contrast  22-Sep-2015  CLINICAL DATA:  Rapidly progressive dementia. History of hypertension, diabetes. EXAM: MRI HEAD WITHOUT AND WITH CONTRAST TECHNIQUE: Multiplanar, multiecho pulse sequences of the brain and surrounding structures were obtained without and with intravenous contrast. CONTRAST:  15mL MULTIHANCE GADOBENATE DIMEGLUMINE 529 MG/ML IV SOLN COMPARISON:  CT HEAD September 04, 2015 FINDINGS: Moderately motion degraded examination, rapid sequences used. INTRACRANIAL CONTENTS: No reduced diffusion to suggest acute ischemia. No susceptibility artifact to suggest hemorrhage. The ventricles and sulci are normal for patient's age. Mild symmetric T2 hyperintense signal in a globus pallidus and to lesser extent LEFT putaminal. No definite abnormal signal within the mesial thalamus. Patchy supratentorial and pontine white  matter FLAIR T2 hyperintensities. No suspicious parenchymal signal, mass lesions, mass effect. No abnormal intraparenchymal or extra-axial enhancement. No abnormal extra-axial fluid collections. No extra-axial masses. Normal major intracranial vascular flow voids present at skull base. ORBITS: The included ocular globes and orbital contents are non-suspicious. Status post bilateral ocular lens implants. SINUSES: The mastoid air-cells and included paranasal sinuses are well-aerated. SKULL/SOFT TISSUES: No abnormal sellar expansion. No suspicious calvarial bone marrow signal. Craniocervical junction maintained. ACDF resultant susceptibility artifact. IMPRESSION: No acute intracranial process on this motion degraded examination. Symmetric abnormal signal within the bilateral globus pallidus which can be associated with liver dysfunction, prior toxic insult, neuro degeneration associated with iron deposition. Involutional changes.  Mild chronic small vessel ischemic disease. Electronically Signed   By: Awilda Metro M.D.   On: 09/08/2015 22:31         Scheduled Meds: . aspirin EC  81 mg Oral Daily  . carvedilol  6.25 mg Oral BID WC  . divalproex  500 mg Oral Q12H  . enoxaparin (LOVENOX) injection  40 mg Subcutaneous Q24H  . FLUoxetine  20 mg Oral Daily  . insulin aspart  0-5 Units Subcutaneous QHS  . insulin aspart  0-9 Units Subcutaneous TID WC  . lisinopril  5 mg Oral Daily  . pravastatin  80 mg Oral Daily  . QUEtiapine  25 mg Oral TID   Continuous Infusions:    LOS: 2 days    Time spent: 13   Haydee Salter, MD [facility] Pager [phone] If 7PM-7AM, please contact night-coverage [email or web address] Password [facility] 09/09/2015, 6:51 PM

## 2015-09-09 NOTE — Progress Notes (Signed)
Subjective: Interval History: none.Marland Kitchen  TSH 0.042 FT4 1.32.  MRI Brain- SVID mild and asymmetric midbrain/pons.  Objective: Vital signs in last 24 hours: Temp:  [97.5 F (36.4 C)-98.9 F (37.2 C)] 98.9 F (37.2 C) (07/22 1015) Pulse Rate:  [57-74] 74 (07/22 1015) Resp:  [16-18] 18 (07/22 1015) BP: (91-120)/(18-72) 103/72 mmHg (07/22 1015) SpO2:  [91 %-97 %] 97 % (07/22 1015)  Intake/Output from previous day: 07/21 0701 - 07/22 0700 In: 600 [P.O.:600] Out: -  Intake/Output this shift:   Nutritional status: Diet Carb Modified Fluid consistency:: Thin; Room service appropriate?: Yes  Neurologic Exam: Awake, alert, eating. Disoriented to year, but knows month and day and place. Language intact. Follows all commands. No focal deficits.  Lab Results:  Recent Labs  09/07/15 0544 09/08/15 0943  WBC  --  10.0  HGB  --  13.4  HCT  --  40.1  PLT  --  250  NA 139 138  K 3.6 4.4  CL 106 107  CO2 25 21*  GLUCOSE 162* 175*  BUN 16 18  CREATININE 0.82 0.85  CALCIUM 9.8 9.4   Lipid Panel No results for input(s): CHOL, TRIG, HDL, CHOLHDL, VLDL, LDLCALC in the last 72 hours.  Studies/Results: Mr Lodema Pilot Contrast  09/08/2015  CLINICAL DATA:  Rapidly progressive dementia. History of hypertension, diabetes. EXAM: MRI HEAD WITHOUT AND WITH CONTRAST TECHNIQUE: Multiplanar, multiecho pulse sequences of the brain and surrounding structures were obtained without and with intravenous contrast. CONTRAST:  15mL MULTIHANCE GADOBENATE DIMEGLUMINE 529 MG/ML IV SOLN COMPARISON:  CT HEAD September 04, 2015 FINDINGS: Moderately motion degraded examination, rapid sequences used. INTRACRANIAL CONTENTS: No reduced diffusion to suggest acute ischemia. No susceptibility artifact to suggest hemorrhage. The ventricles and sulci are normal for patient's age. Mild symmetric T2 hyperintense signal in a globus pallidus and to lesser extent LEFT putaminal. No definite abnormal signal within the mesial thalamus.  Patchy supratentorial and pontine white matter FLAIR T2 hyperintensities. No suspicious parenchymal signal, mass lesions, mass effect. No abnormal intraparenchymal or extra-axial enhancement. No abnormal extra-axial fluid collections. No extra-axial masses. Normal major intracranial vascular flow voids present at skull base. ORBITS: The included ocular globes and orbital contents are non-suspicious. Status post bilateral ocular lens implants. SINUSES: The mastoid air-cells and included paranasal sinuses are well-aerated. SKULL/SOFT TISSUES: No abnormal sellar expansion. No suspicious calvarial bone marrow signal. Craniocervical junction maintained. ACDF resultant susceptibility artifact. IMPRESSION: No acute intracranial process on this motion degraded examination. Symmetric abnormal signal within the bilateral globus pallidus which can be associated with liver dysfunction, prior toxic insult, neuro degeneration associated with iron deposition. Involutional changes.  Mild chronic small vessel ischemic disease. Electronically Signed   By: Awilda Metro M.D.   On: 09/08/2015 22:31    Medications:  Scheduled: . aspirin EC  81 mg Oral Daily  . carvedilol  6.25 mg Oral BID WC  . divalproex  500 mg Oral Q12H  . enoxaparin (LOVENOX) injection  40 mg Subcutaneous Q24H  . FLUoxetine  20 mg Oral Daily  . insulin aspart  0-5 Units Subcutaneous QHS  . insulin aspart  0-9 Units Subcutaneous TID WC  . lisinopril  5 mg Oral Daily  . pravastatin  80 mg Oral Daily  . QUEtiapine  25 mg Oral TID    Assessment/Plan:  Altered mental status likely due to hyperthyroidism.  Much of it sounds like mania from that.  She may have underlying dementia slowly progressive.    She does not exhibits  signs of CJD or encephalitis and I feel LP is not necessary.  An EEG would be useful to assess her cortical function.     LOS: 2 days   Weston Settle, MD 09/09/2015  11:51 AM

## 2015-09-09 NOTE — Progress Notes (Addendum)
Patient has become increasingly confused and agitated this evening. Will continue to monitor.

## 2015-09-10 LAB — BASIC METABOLIC PANEL
Anion gap: 6 (ref 5–15)
BUN: 29 mg/dL — AB (ref 6–20)
CHLORIDE: 109 mmol/L (ref 101–111)
CO2: 24 mmol/L (ref 22–32)
CREATININE: 0.9 mg/dL (ref 0.44–1.00)
Calcium: 9.4 mg/dL (ref 8.9–10.3)
GFR calc Af Amer: >60 mL/min (ref >60)
Glucose, Bld: 146 mg/dL — ABNORMAL HIGH (ref 65–99)
Potassium: 4.1 mmol/L (ref 3.5–5.1)
SODIUM: 139 mmol/L (ref 135–145)

## 2015-09-10 LAB — CBC WITH DIFFERENTIAL/PLATELET
Basophils Absolute: 0 10*3/uL (ref 0.0–0.1)
Basophils Relative: 0 %
EOS ABS: 0.3 10*3/uL (ref 0.0–0.7)
EOS PCT: 4 %
HCT: 38.6 % (ref 36.0–46.0)
Hemoglobin: 12.5 g/dL (ref 12.0–15.0)
LYMPHS ABS: 3.4 10*3/uL (ref 0.7–4.0)
Lymphocytes Relative: 35 %
MCH: 30.3 pg (ref 26.0–34.0)
MCHC: 32.4 g/dL (ref 30.0–36.0)
MCV: 93.5 fL (ref 78.0–100.0)
MONOS PCT: 11 %
Monocytes Absolute: 1.1 10*3/uL — ABNORMAL HIGH (ref 0.1–1.0)
Neutro Abs: 4.9 10*3/uL (ref 1.7–7.7)
Neutrophils Relative %: 50 %
PLATELETS: 272 10*3/uL (ref 150–400)
RBC: 4.13 MIL/uL (ref 3.87–5.11)
RDW: 13.2 % (ref 11.5–15.5)
WBC: 9.8 10*3/uL (ref 4.0–10.5)

## 2015-09-10 LAB — GLUCOSE, CAPILLARY
Glucose-Capillary: 148 mg/dL — ABNORMAL HIGH (ref 65–99)
Glucose-Capillary: 159 mg/dL — ABNORMAL HIGH (ref 65–99)
Glucose-Capillary: 157 mg/dL — ABNORMAL HIGH (ref 65–99)
Glucose-Capillary: 223 mg/dL — ABNORMAL HIGH (ref 65–99)

## 2015-09-10 MED ORDER — METHIMAZOLE 5 MG PO TABS
15.0000 mg | ORAL_TABLET | Freq: Every day | ORAL | Status: DC
  Administered 2015-09-10 – 2015-09-12 (×3): 15 mg via ORAL
  Filled 2015-09-10 (×5): qty 1

## 2015-09-10 MED ORDER — KCL IN DEXTROSE-NACL 20-5-0.45 MEQ/L-%-% IV SOLN
INTRAVENOUS | Status: AC
  Administered 2015-09-11: 01:00:00 via INTRAVENOUS
  Filled 2015-09-10 (×2): qty 1000

## 2015-09-10 MED ORDER — LORAZEPAM 2 MG/ML IJ SOLN
0.5000 mg | Freq: Once | INTRAMUSCULAR | Status: AC
  Administered 2015-09-10: 0.5 mg via INTRAVENOUS
  Filled 2015-09-10: qty 1

## 2015-09-10 NOTE — Progress Notes (Signed)
PROGRESS NOTE    Tamara Shaw  ZOX:[id number] DOB: 1945/03/28 DOA: 09/04/2015 PCP: Bjorn Loser, DO    Brief Narrative:  Pt still confused neuro and psych consulted. Waiting to see if this is drug induced delerium. Holding benzo and synthroid. Pt improving. Due to pos inflammatory markers with no signs of infection working up pt for thyroiditis induced hyperthyroidism.  Today's plan - no LP for now, update family, prepare for likely Tuesday d/c to SNF  Assessment & Plan:   Principal Problem:   Delirium Active Problems:   Diabetes mellitus without complication (HCC)   Essential hypertension   Hypothyroidism   Leukocytosis   Elevated WBC count   Generalized abdominal discomfort   Assessment/Plan 1. Delirium:  The patient appears to have waxing and waning cognition and attention. There is a likelihood that she has some underlying dementia, and she is currently tapered to a lower dose of alprazolam and starting quetiapine, which could contribute to delirium. No fever or tachycardia, so blood cultures deferred. -Neurology consult, recommendations appreciated -EEG no seizure acitivty -MRI brain is ordered - neg - will do seroquel per psych recs - CBC in AM - blood cult 2/20 no growth for 2 days in 2 bottles  2. Diabetes:  -Holding home metformin -Sliding scale corrections as needed  3. HTN:  -Continue home carvedilol, lisinopril, aspirin, statin  4. Hypothyoidism:  -Check TSH and is low 0.042 (hyperthyroid), free T4 elevated at 1.32 - cont holding home levothyroxine - thought to be accidental drug overdose in patient who lives alone, due to elevated inflammatory markers without infectious source will evaluate for thyroiditis, scan ordered  5. Leukocytosis:  Etiology unclear. -Repeat CBC in AM shows resolution, elevated inflammatory markers? - CXR neg, UA neg, Abd XR neg  6. Anxiety or depression: -Continue home fluoxetine -Hold home alprazolam -Hold other  psychoactive medicines for now (new quetiapine) - psych has signed of stating not likely psych etiology given sudden onset   DVT prophylaxis:lovenox Code Status: full Family Communication: unavailable Disposition Plan: TBD   Consultants:   Psych, Neuro  Procedures:  n/a  Antimicrobials:  n/A  Subjective: Pt states tired. Denies pain. Denies fever. Per nursing pt more calm and had some confusion overnight.   Objective: Vitals:   09/10/15 0149 09/10/15 0558 09/10/15 0930 09/10/15 1330  BP: (!) 133/91 118/66 (!) 98/54 (!) 132/94  Pulse: 81 88 69 77  Resp: Temp: 98 F (36.7 C) 98.3 F (36.8 C) 97.7 F (36.5 C) 98.1 F (36.7 C)  TempSrc: Oral Oral Oral Oral  SpO2: 98% 98% 98% 97%  Weight:        Intake/Output Summary (Last 24 hours) at 09/10/15 1534 Last data filed at 09/10/15 0558  Gross per 24 hour  Intake              240 ml  Output                0 ml  Net              240 ml   Filed Weights   09/05/15 0548  Weight: 74 kg (163 lb 1.6 oz)    Examination:  General exam: Appears calm and comfortable, tired Respiratory system: Clear to auscultation. Respiratory effort normal. Cardiovascular system: S1 & S2 heard, RRR. No JVD, murmurs, rubs, gallops or clicks. No pedal edema. Gastrointestinal system: Abdomen is nondistended, soft and With generalized tenderness . No organomegaly or masses felt. Normal  bowel sounds heard. Central nervous system: Alert and oriented. No focal neurological deficits. Extremities: Symmetric 5 x 5 power. Skin: No rashes, lesions or ulcers Psychiatry/Neuro: Calm, A&Ox2, patient easily redirected, speech less pressured, making single religious reference to Jesus    Data Reviewed: I have personally reviewed following labs and imaging studies  CBC:  Recent Labs Lab 09/05/15 0218 09/08/15 0943 09/09/15 1347 09/10/15 0430  WBC 12.0* 10.0 9.4 9.8  NEUTROABS 6.9 6.3 5.4 4.9  HGB 13.0 13.4 12.6 12.5  HCT 39.0 40.1  38.1 38.6  MCV 93.5 93.3 94.3 93.5  PLT 275 250 264 272   Basic Metabolic Panel:  Recent Labs Lab 09/05/15 0218 09/06/15 0344 09/07/15 0544 09/08/15 0943 09/09/15 1347 09/10/15 0430  NA 142  --  139 138 138 139  K 3.8  --  3.6 4.4 3.9 4.1  CL 109  --  106 107 105 109  CO2 26  --  25 21* 25 24  GLUCOSE 163*  --  162* 175* 259* 146*  BUN 28*  --  16 18 28* 29*  CREATININE 0.87 0.76 0.82 0.85 1.23* 0.90  CALCIUM 9.8  --  9.8 9.4 9.0 9.4  PHOS  --   --  3.4  --   --   --    GFR: CrCl cannot be calculated (Unknown ideal weight.). Liver Function Tests:  Recent Labs Lab 09/05/15 0218 09/07/15 0544  AST 19  --   ALT 15  --   ALKPHOS 82  --   BILITOT 0.6  --   PROT 7.4  --   ALBUMIN 4.1 3.6   No results for input(s): LIPASE, AMYLASE in the last 168 hours.  Recent Labs Lab 09/07/15 2010  AMMONIA 33   Coagulation Profile: No results for input(s): INR, PROTIME in the last 168 hours. Cardiac Enzymes: No results for input(s): CKTOTAL, CKMB, CKMBINDEX, TROPONINI in the last 168 hours. BNP (last 3 results) No results for input(s): PROBNP in the last 8760 hours. HbA1C: No results for input(s): HGBA1C in the last 72 hours. CBG:  Recent Labs Lab 09/09/15 1116 09/09/15 1604 09/09/15 2146 09/10/15 0613 09/10/15 1139  GLUCAP 196* 229* 243* 148* 159*   Lipid Profile: No results for input(s): CHOL, HDL, LDLCALC, TRIG, CHOLHDL, LDLDIRECT in the last 72 hours. Thyroid Function Tests:  Recent Labs  09/07/15 2010  FREET4 1.32*   Anemia Panel: No results for input(s): VITAMINB12, FOLATE, FERRITIN, TIBC, IRON, RETICCTPCT in the last 72 hours. Sepsis Labs: No results for input(s): PROCALCITON, LATICACIDVEN in the last 168 hours.  Recent Results (from the past 240 hour(s))  Culture, blood (Routine X 2) w Reflex to ID Panel     Status: None (Preliminary result)   Collection Time: 09/07/15  8:10 PM  Result Value Ref Range Status   Specimen Description BLOOD LEFT  ANTECUBITAL  Final   Special Requests IN PEDIATRIC BOTTLE 3CC  Final   Culture NO GROWTH 2 DAYS  Final   Report Status PENDING  Incomplete  Culture, blood (Routine X 2) w Reflex to ID Panel     Status: None (Preliminary result)   Collection Time: 09/07/15  8:25 PM  Result Value Ref Range Status   Specimen Description BLOOD LEFT HAND  Final   Special Requests BOTTLES DRAWN AEROBIC ONLY 5CC  Final   Culture NO GROWTH 2 DAYS  Final   Report Status PENDING  Incomplete         Radiology Studies: Mr Laqueta Jean Wo Contrast  Result Date: 09/08/2015 CLINICAL DATA:  Rapidly progressive dementia. History of hypertension, diabetes. EXAM: MRI HEAD WITHOUT AND WITH CONTRAST TECHNIQUE: Multiplanar, multiecho pulse sequences of the brain and surrounding structures were obtained without and with intravenous contrast. CONTRAST:  3mL MULTIHANCE GADOBENATE DIMEGLUMINE 529 MG/ML IV SOLN COMPARISON:  CT HEAD September 04, 2015 FINDINGS: Moderately motion degraded examination, rapid sequences used. INTRACRANIAL CONTENTS: No reduced diffusion to suggest acute ischemia. No susceptibility artifact to suggest hemorrhage. The ventricles and sulci are normal for patient's age. Mild symmetric T2 hyperintense signal in a globus pallidus and to lesser extent LEFT putaminal. No definite abnormal signal within the mesial thalamus. Patchy supratentorial and pontine white matter FLAIR T2 hyperintensities. No suspicious parenchymal signal, mass lesions, mass effect. No abnormal intraparenchymal or extra-axial enhancement. No abnormal extra-axial fluid collections. No extra-axial masses. Normal major intracranial vascular flow voids present at skull base. ORBITS: The included ocular globes and orbital contents are non-suspicious. Status post bilateral ocular lens implants. SINUSES: The mastoid air-cells and included paranasal sinuses are well-aerated. SKULL/SOFT TISSUES: No abnormal sellar expansion. No suspicious calvarial bone marrow  signal. Craniocervical junction maintained. ACDF resultant susceptibility artifact. IMPRESSION: No acute intracranial process on this motion degraded examination. Symmetric abnormal signal within the bilateral globus pallidus which can be associated with liver dysfunction, prior toxic insult, neuro degeneration associated with iron deposition. Involutional changes.  Mild chronic small vessel ischemic disease. Electronically Signed   By: Awilda Metro M.D.   On: 09/08/2015 22:31        Scheduled Meds: . aspirin EC  81 mg Oral Daily  . carvedilol  6.25 mg Oral BID WC  . divalproex  500 mg Oral Q12H  . enoxaparin (LOVENOX) injection  40 mg Subcutaneous Q24H  . FLUoxetine  20 mg Oral Daily  . insulin aspart  0-5 Units Subcutaneous QHS  . insulin aspart  0-9 Units Subcutaneous TID WC  . lisinopril  5 mg Oral Daily  . methimazole  15 mg Oral Daily  . pravastatin  80 mg Oral Daily  . QUEtiapine  25 mg Oral TID   Continuous Infusions: . [START ON 09/11/2015] dextrose 5 % and 0.45 % NaCl with KCl 20 mEq/L       LOS: 3 days    Time spent: 19   Haydee Salter, MD [facility] Pager [phone] If 7PM-7AM, please contact night-coverage [email or web address] Password [email or web address] 09/10/2015, 3:34 PM

## 2015-09-10 NOTE — Progress Notes (Signed)
Subjective: Interval History: Feels well.  No complaints.  Objective: Vital signs in last 24 hours: Temp:  [97.7 F (36.5 C)-98.5 F (36.9 C)] 97.7 F (36.5 C) (07/23 0930) Pulse Rate:  [68-88] 69 (07/23 0930) Resp:  [18-20] 18 (07/23 0930) BP: (92-133)/(52-91) 98/54 (07/23 0930) SpO2:  [98 %-100 %] 98 % (07/23 0930)  Intake/Output from previous day: 07/22 0701 - 07/23 0700 In: 600 [P.O.:600] Out: -  Intake/Output this shift: No intake/output data recorded. Nutritional status: Diet Carb Modified Fluid consistency:: Thin; Room service appropriate?: Yes  Neurologic Exam: Awake, alert. Oriented to city, state, place, but not year, month, date,or president. No focal deficits.  Lab Results:  Recent Labs  09/09/15 1347 09/10/15 0430  WBC 9.4 9.8  HGB 12.6 12.5  HCT 38.1 38.6  PLT 264 272  NA 138 139  K 3.9 4.1  CL 105 109  CO2 25 24  GLUCOSE 259* 146*  BUN 28* 29*  CREATININE 1.23* 0.90  CALCIUM 9.0 9.4   Lipid Panel No results for input(s): CHOL, TRIG, HDL, CHOLHDL, VLDL, LDLCALC in the last 72 hours.  Studies/Results: Mr Laqueta Jean WU Contrast  Result Date: 09/08/2015 CLINICAL DATA:  Rapidly progressive dementia. History of hypertension, diabetes. EXAM: MRI HEAD WITHOUT AND WITH CONTRAST TECHNIQUE: Multiplanar, multiecho pulse sequences of the brain and surrounding structures were obtained without and with intravenous contrast. CONTRAST:  15mL MULTIHANCE GADOBENATE DIMEGLUMINE 529 MG/ML IV SOLN COMPARISON:  CT HEAD September 04, 2015 FINDINGS: Moderately motion degraded examination, rapid sequences used. INTRACRANIAL CONTENTS: No reduced diffusion to suggest acute ischemia. No susceptibility artifact to suggest hemorrhage. The ventricles and sulci are normal for patient's age. Mild symmetric T2 hyperintense signal in a globus pallidus and to lesser extent LEFT putaminal. No definite abnormal signal within the mesial thalamus. Patchy supratentorial and pontine white matter  FLAIR T2 hyperintensities. No suspicious parenchymal signal, mass lesions, mass effect. No abnormal intraparenchymal or extra-axial enhancement. No abnormal extra-axial fluid collections. No extra-axial masses. Normal major intracranial vascular flow voids present at skull base. ORBITS: The included ocular globes and orbital contents are non-suspicious. Status post bilateral ocular lens implants. SINUSES: The mastoid air-cells and included paranasal sinuses are well-aerated. SKULL/SOFT TISSUES: No abnormal sellar expansion. No suspicious calvarial bone marrow signal. Craniocervical junction maintained. ACDF resultant susceptibility artifact. IMPRESSION: No acute intracranial process on this motion degraded examination. Symmetric abnormal signal within the bilateral globus pallidus which can be associated with liver dysfunction, prior toxic insult, neuro degeneration associated with iron deposition. Involutional changes.  Mild chronic small vessel ischemic disease. Electronically Signed   By: Awilda Metro M.D.   On: 09/08/2015 22:31    Medications:  Scheduled: . aspirin EC  81 mg Oral Daily  . carvedilol  6.25 mg Oral BID WC  . divalproex  500 mg Oral Q12H  . enoxaparin (LOVENOX) injection  40 mg Subcutaneous Q24H  . FLUoxetine  20 mg Oral Daily  . insulin aspart  0-5 Units Subcutaneous QHS  . insulin aspart  0-9 Units Subcutaneous TID WC  . lisinopril  5 mg Oral Daily  . pravastatin  80 mg Oral Daily  . QUEtiapine  25 mg Oral TID    Assessment/Plan:  Altered mental status.  The hyperthyroidism has not been addressed and needs to be addressed with Methimazole. A TSI antibody should be drawn too.  She may have underlying dementia too, but that needs to be addressed once thyroid is normalized as outpatient.  Will sign off.  Patient to follow  up with outpatient neurologist and endocrinologist.     LOS: 3 days   Weston Settle, MD 09/10/2015  12:23 PM

## 2015-09-10 NOTE — Clinical Social Work Note (Signed)
Chart review indicates that patient became increasingly confused and agitated last evening per nursing.  SNF search continues SW will need to monitor for medical stability.  Lorri Frederick. Lari Linson, LCSW [phone]  (weekend d/c).

## 2015-09-11 LAB — BASIC METABOLIC PANEL
Anion gap: 6 (ref 5–15)
BUN: 17 mg/dL (ref 6–20)
CHLORIDE: 107 mmol/L (ref 101–111)
CO2: 27 mmol/L (ref 22–32)
CREATININE: 0.81 mg/dL (ref 0.44–1.00)
Calcium: 9.4 mg/dL (ref 8.9–10.3)
Glucose, Bld: 116 mg/dL — ABNORMAL HIGH (ref 65–99)
POTASSIUM: 3.7 mmol/L (ref 3.5–5.1)
SODIUM: 140 mmol/L (ref 135–145)

## 2015-09-11 LAB — T4, FREE: FREE T4: 0.88 ng/dL (ref 0.61–1.12)

## 2015-09-11 LAB — CBC WITH DIFFERENTIAL/PLATELET
BASOS PCT: 0 %
Basophils Absolute: 0 10*3/uL (ref 0.0–0.1)
EOS ABS: 0.4 10*3/uL (ref 0.0–0.7)
EOS PCT: 5 %
HCT: 37.5 % (ref 36.0–46.0)
HEMOGLOBIN: 12.4 g/dL (ref 12.0–15.0)
LYMPHS ABS: 3.4 10*3/uL (ref 0.7–4.0)
Lymphocytes Relative: 41 %
MCH: 30.8 pg (ref 26.0–34.0)
MCHC: 33.1 g/dL (ref 30.0–36.0)
MCV: 93.1 fL (ref 78.0–100.0)
Monocytes Absolute: 0.9 10*3/uL (ref 0.1–1.0)
Monocytes Relative: 11 %
NEUTROS PCT: 43 %
Neutro Abs: 3.6 10*3/uL (ref 1.7–7.7)
PLATELETS: 268 10*3/uL (ref 150–400)
RBC: 4.03 MIL/uL (ref 3.87–5.11)
RDW: 13 % (ref 11.5–15.5)
WBC: 8.3 10*3/uL (ref 4.0–10.5)

## 2015-09-11 LAB — URINALYSIS, ROUTINE W REFLEX MICROSCOPIC
BILIRUBIN URINE: NEGATIVE
Glucose, UA: NEGATIVE mg/dL
Hgb urine dipstick: NEGATIVE
Ketones, ur: NEGATIVE mg/dL
NITRITE: NEGATIVE
PH: 6 (ref 5.0–8.0)
Protein, ur: 30 mg/dL — AB
SPECIFIC GRAVITY, URINE: 1.014 (ref 1.005–1.030)

## 2015-09-11 LAB — URINE MICROSCOPIC-ADD ON: RBC / HPF: NONE SEEN RBC/hpf (ref 0–5)

## 2015-09-11 LAB — GLUCOSE, CAPILLARY
GLUCOSE-CAPILLARY: 136 mg/dL — AB (ref 65–99)
GLUCOSE-CAPILLARY: 200 mg/dL — AB (ref 65–99)
Glucose-Capillary: 215 mg/dL — ABNORMAL HIGH (ref 65–99)
Glucose-Capillary: 176 mg/dL — ABNORMAL HIGH (ref 65–99)

## 2015-09-11 LAB — TSH: TSH: 0.089 u[IU]/mL — AB (ref 0.350–4.500)

## 2015-09-11 LAB — VITAMIN B1: VITAMIN B1 (THIAMINE): 152.7 nmol/L (ref 66.5–200.0)

## 2015-09-11 NOTE — Care Management Important Message (Signed)
Important Message  Patient Details  Name: Tamara Shaw MRN: [id number] Date of Birth: 09-18-1945   Medicare Important Message Given:  Yes    Bernadette Hoit 09/11/2015, 9:02 AM

## 2015-09-11 NOTE — Progress Notes (Signed)
Occupational Therapy Treatment Patient Details Name: Tamara Shaw MRN: [id number] DOB: 1946/01/13 Today's Date: 09/11/2015    History of present illness Tamara Shaw is a 70 y.o. female patient with history of depression, unspecified cognitive disorder, hypothyroidism, diabetes, hypertension who is consulted for hallucinations.    OT comments  Pt making steady progress. More concerned about pt's functional safety as related cognitive deficits. OT will continue to follow  Follow Up Recommendations  SNF;Supervision/Assistance - 24 hour    Equipment Recommendations  Other (comment) (TBD)    Recommendations for Other Services      Precautions / Restrictions Precautions Precautions: Fall Restrictions Weight Bearing Restrictions: No       Mobility Bed Mobility               General bed mobility comments: pt up in recliner  Transfers Overall transfer level: Needs assistance Equipment used: 1 person hand held assist Transfers: Sit to/from Stand Sit to Stand: Min guard              Balance   Sitting-balance support: No upper extremity supported;Feet supported Sitting balance-Leahy Scale: Good     Standing balance support: During functional activity Standing balance-Leahy Scale: Fair                     ADL       Grooming: Min guard;Standing;Wash/dry hands;Wash/dry face   Upper Body Bathing: Min guard;Standing   Lower Body Bathing: Sit to/from stand;Minimal assistance           Toilet Transfer: Regular Toilet;Grab bars;Ambulation;Min guard Statistician Details (indicate cue type and reason): pt attemtpting to stand from toilet without assist or using grab bar         Functional mobility during ADLs: Min guard;Cueing for safety General ADL Comments: Pt required mod verbal cues for safe initiation and completeion of tasks while standing. Non sensical speech about various topics      Vision  no change from baseline                              Cognition   Behavior During Therapy: Flat affect Overall Cognitive Status: Impaired/Different from baseline Area of Impairment: Memory;Following commands;Awareness;Safety/judgement     Memory: Decreased recall of precautions;Decreased short-term memory  Following Commands: Follows one step commands with increased time;Follows one step commands inconsistently Safety/Judgement: Decreased awareness of deficits;Decreased awareness of safety     General Comments: nonsensical words and words out of context     Extremity/Trunk Assessment   generalized weakness                        General Comments  pt pleasantly confused, cooperative    Pertinent Vitals/ Pain       Pain Assessment: No/denies pain                                                          Frequency Min 2X/week     Progress Toward Goals  OT Goals(current goals can now be found in the care plan section)  Progress towards OT goals: Progressing toward goals     Plan Discharge plan remains appropriate  End of Session Equipment Utilized During Treatment: Gait belt   Activity Tolerance Patient tolerated treatment well   Patient Left in chair;with chair alarm set;with call bell/phone within reach;with nursing/sitter in room   Nurse Communication Mobility status    Functional Assessment Tool Used: Clinical judgement Functional Limitation: Self care Self Care Current Status (Z6109): At least 20 percent but less than 40 percent impaired, limited or restricted Self Care Goal Status (U0454): At least 1 percent but less than 20 percent impaired, limited or restricted   Time: 1020-1044 OT Time Calculation (min): 24 min  Charges: OT G-codes **NOT FOR INPATIENT CLASS** Functional Assessment Tool Used: Clinical judgement Functional Limitation: Self care Self Care Current Status (U9811): At least 20 percent but less than 40  percent impaired, limited or restricted Self Care Goal Status (B1478): At least 1 percent but less than 20 percent impaired, limited or restricted  Galen Manila 09/11/2015, 1:38 PM

## 2015-09-11 NOTE — Progress Notes (Signed)
PROGRESS NOTE    Tamara Shaw  FAO:[id number] DOB: 1945-05-25 DOA: 09/04/2015 PCP: Bjorn Loser, DO    Brief Narrative:  Pt  confused neuro and psych consulted. Waiting to see if this is drug induced delerium. Holding benzo and synthroid. Pt improving. Due to pos inflammatory markers with no signs of infection working up pt for thyroiditis induced hyperthyroidism started but unable to complete for weeks when she can have scan due to being on synthroid.   Today's plan - no LP for now, updated family, prepare for likely Tuesday d/c to SNF  Assessment & Plan:   Principal Problem:   Delirium Active Problems:   Diabetes mellitus without complication (HCC)   Essential hypertension   Hypothyroidism   Leukocytosis   Elevated WBC count   Generalized abdominal discomfort   Assessment/Plan 1. Delirium:  The patient appears to have waxing and waning cognition and attention. There is a likelihood that she has some underlying dementia, and she is currently tapered to a lower dose of alprazolam and starting quetiapine, which could contribute to delirium. No fever or tachycardia, so blood cultures deferred. -Neurology consult, recommendations appreciated -EEG no seizure acitivty -MRI brain is ordered - neg - will do seroquel per psych recs - CBC in AM - blood cult 2/20 no growth for 2 days in 2 bottles - spoke to neuro today and son of pt, pt had hallucinations of Jesus for months prior to now, her worsening is acute, clinically she seems better, may progress more with time  2. Diabetes:  -Holding home metformin -Sliding scale corrections as needed  3. HTN:  -Continue home carvedilol, lisinopril, aspirin, statin  4. Hypothyoidism:  -Check TSH and is low 0.042 (hyperthyroid), free T4 elevated at 1.32 - cont holding home levothyroxine - thought to be accidental drug overdose in patient who lives alone, due to elevated inflammatory markers without infectious source will nee op  evaluation for thyroiditis  5. Leukocytosis:  Etiology unclear. -Repeat CBC in AM shows resolution, elevated inflammatory markers? - CXR neg, UA neg, Abd XR neg  6. Anxiety or depression: -Continue home fluoxetine -Hold home alprazolam -Hold other psychoactive medicines for now (new quetiapine) - psych has signed of stating not likely psych etiology given sudden onset   DVT prophylaxis:lovenox Code Status: full Family Communication: son, phone Disposition Plan: TBD   Consultants:   Psych signed off, Neuro signed off  Procedures:  n/a  Antimicrobials:  n/A  Subjective: Pt states well. Denies pain. Denies fever. Per nursing pt more calm and had some confusion overnight.   Objective: Vitals:   09/11/15 1448 09/11/15 1656 09/11/15 1853 09/11/15 2105  BP: 117/76 118/69 120/70 (!) 141/89  Pulse: 88 71 89 67  Resp: Temp: 98 F (36.7 C) 98.7 F (37.1 C) 98.2 F (36.8 C) 98.2 F (36.8 C)  TempSrc: Oral Oral Oral Oral  SpO2: 99% 96% 97% 98%  Weight:      Height:       No intake or output data in the 24 hours ending 09/11/15 2320 Filed Weights   09/05/15 0548  Weight: 74 kg (163 lb 1.6 oz)    Examination:  General exam: Appears calm and comfortable, tired Respiratory system: Clear to auscultation. Respiratory effort normal. Cardiovascular system: S1 & S2 heard, RRR. No JVD, murmurs, rubs, gallops or clicks. No pedal edema. Gastrointestinal system: Abdomen is nondistended, soft and With generalized tenderness . No organomegaly or masses felt. Normal bowel sounds heard. Central  nervous system: Alert and orientedx. No focal neurological deficits. Extremities: Symmetric 5 x 5 power. Skin: No rashes, lesions or ulcers Psychiatry/Neuro: Calm, A&Ox2, patient easily redirected, speech less pressured, making single religious reference to Jesus    Data Reviewed: I have personally reviewed following labs and imaging studies  CBC:  Recent Labs Lab  09/05/15 0218 09/08/15 0943 09/09/15 1347 09/10/15 0430 09/11/15 0528  WBC 12.0* 10.0 9.4 9.8 8.3  NEUTROABS 6.9 6.3 5.4 4.9 3.6  HGB 13.0 13.4 12.6 12.5 12.4  HCT 39.0 40.1 38.1 38.6 37.5  MCV 93.5 93.3 94.3 93.5 93.1  PLT 275 250 264 272 268   Basic Metabolic Panel:  Recent Labs Lab 09/07/15 0544 09/08/15 0943 09/09/15 1347 09/10/15 0430 09/11/15 0528  NA 139 138 138 139 140  K 3.6 4.4 3.9 4.1 3.7  CL 106 107 105 109 107  CO2 25 21* 25 24 27   GLUCOSE 162* 175* 259* 146* 116*  BUN 16 18 28* 29* 17  CREATININE 0.82 0.85 1.23* 0.90 0.81  CALCIUM 9.8 9.4 9.0 9.4 9.4  PHOS 3.4  --   --   --   --    GFR: Estimated Creatinine Clearance: 59.2 mL/min (by C-G formula based on SCr of 0.81 mg/dL). Liver Function Tests:  Recent Labs Lab 09/05/15 0218 09/07/15 0544  AST 19  --   ALT 15  --   ALKPHOS 82  --   BILITOT 0.6  --   PROT 7.4  --   ALBUMIN 4.1 3.6   No results for input(s): LIPASE, AMYLASE in the last 168 hours.  Recent Labs Lab 09/07/15 2010  AMMONIA 33   Coagulation Profile: No results for input(s): INR, PROTIME in the last 168 hours. Cardiac Enzymes: No results for input(s): CKTOTAL, CKMB, CKMBINDEX, TROPONINI in the last 168 hours. BNP (last 3 results) No results for input(s): PROBNP in the last 8760 hours. HbA1C: No results for input(s): HGBA1C in the last 72 hours. CBG:  Recent Labs Lab 09/10/15 2134 09/11/15 0603 09/11/15 1046 09/11/15 1649 09/11/15 2104  GLUCAP 223* 136* 215* 200* 176*   Lipid Profile: No results for input(s): CHOL, HDL, LDLCALC, TRIG, CHOLHDL, LDLDIRECT in the last 72 hours. Thyroid Function Tests:  Recent Labs  09/11/15 0528  TSH 0.089*  FREET4 0.88   Anemia Panel: No results for input(s): VITAMINB12, FOLATE, FERRITIN, TIBC, IRON, RETICCTPCT in the last 72 hours. Sepsis Labs: No results for input(s): PROCALCITON, LATICACIDVEN in the last 168 hours.  Recent Results (from the past 240 hour(s))  Culture,  blood (Routine X 2) w Reflex to ID Panel     Status: None (Preliminary result)   Collection Time: 09/07/15  8:10 PM  Result Value Ref Range Status   Specimen Description BLOOD LEFT ANTECUBITAL  Final   Special Requests IN PEDIATRIC BOTTLE 3CC  Final   Culture NO GROWTH 4 DAYS  Final   Report Status PENDING  Incomplete  Culture, blood (Routine X 2) w Reflex to ID Panel     Status: None (Preliminary result)   Collection Time: 09/07/15  8:25 PM  Result Value Ref Range Status   Specimen Description BLOOD LEFT HAND  Final   Special Requests BOTTLES DRAWN AEROBIC ONLY 5CC  Final   Culture NO GROWTH 4 DAYS  Final   Report Status PENDING  Incomplete         Radiology Studies: No results found.      Scheduled Meds: . aspirin EC  81 mg Oral Daily  .  carvedilol  6.25 mg Oral BID WC  . divalproex  500 mg Oral Q12H  . enoxaparin (LOVENOX) injection  40 mg Subcutaneous Q24H  . FLUoxetine  20 mg Oral Daily  . insulin aspart  0-5 Units Subcutaneous QHS  . insulin aspart  0-9 Units Subcutaneous TID WC  . lisinopril  5 mg Oral Daily  . methimazole  15 mg Oral Daily  . pravastatin  80 mg Oral Daily  . QUEtiapine  25 mg Oral TID   Continuous Infusions:     LOS: 4 days    Time spent: 14   Haydee Salter, MD [facility] Pager [phone] If 7PM-7AM, please contact night-coverage [email or web address] Password [facility] 09/11/2015, 11:20 PM

## 2015-09-12 DIAGNOSIS — R1084 Generalized abdominal pain: Secondary | ICD-10-CM | POA: Diagnosis not present

## 2015-09-12 DIAGNOSIS — M6281 Muscle weakness (generalized): Secondary | ICD-10-CM | POA: Diagnosis not present

## 2015-09-12 DIAGNOSIS — Z741 Need for assistance with personal care: Secondary | ICD-10-CM | POA: Diagnosis not present

## 2015-09-12 DIAGNOSIS — F0391 Unspecified dementia with behavioral disturbance: Secondary | ICD-10-CM | POA: Diagnosis not present

## 2015-09-12 DIAGNOSIS — Z046 Encounter for general psychiatric examination, requested by authority: Secondary | ICD-10-CM | POA: Diagnosis present

## 2015-09-12 DIAGNOSIS — F339 Major depressive disorder, recurrent, unspecified: Secondary | ICD-10-CM | POA: Diagnosis not present

## 2015-09-12 DIAGNOSIS — F23 Brief psychotic disorder: Secondary | ICD-10-CM | POA: Diagnosis not present

## 2015-09-12 DIAGNOSIS — R443 Hallucinations, unspecified: Secondary | ICD-10-CM | POA: Diagnosis not present

## 2015-09-12 DIAGNOSIS — I679 Cerebrovascular disease, unspecified: Secondary | ICD-10-CM | POA: Diagnosis not present

## 2015-09-12 DIAGNOSIS — G9340 Encephalopathy, unspecified: Secondary | ICD-10-CM | POA: Diagnosis not present

## 2015-09-12 DIAGNOSIS — R109 Unspecified abdominal pain: Secondary | ICD-10-CM | POA: Diagnosis not present

## 2015-09-12 DIAGNOSIS — Z7982 Long term (current) use of aspirin: Secondary | ICD-10-CM | POA: Diagnosis not present

## 2015-09-12 DIAGNOSIS — R262 Difficulty in walking, not elsewhere classified: Secondary | ICD-10-CM | POA: Diagnosis not present

## 2015-09-12 DIAGNOSIS — E0590 Thyrotoxicosis, unspecified without thyrotoxic crisis or storm: Secondary | ICD-10-CM | POA: Diagnosis not present

## 2015-09-12 DIAGNOSIS — I10 Essential (primary) hypertension: Secondary | ICD-10-CM | POA: Diagnosis not present

## 2015-09-12 DIAGNOSIS — Z79899 Other long term (current) drug therapy: Secondary | ICD-10-CM | POA: Diagnosis not present

## 2015-09-12 DIAGNOSIS — R410 Disorientation, unspecified: Secondary | ICD-10-CM | POA: Diagnosis not present

## 2015-09-12 DIAGNOSIS — F29 Unspecified psychosis not due to a substance or known physiological condition: Secondary | ICD-10-CM | POA: Diagnosis not present

## 2015-09-12 DIAGNOSIS — D729 Disorder of white blood cells, unspecified: Secondary | ICD-10-CM | POA: Diagnosis not present

## 2015-09-12 DIAGNOSIS — E119 Type 2 diabetes mellitus without complications: Secondary | ICD-10-CM | POA: Diagnosis not present

## 2015-09-12 DIAGNOSIS — E039 Hypothyroidism, unspecified: Secondary | ICD-10-CM | POA: Diagnosis not present

## 2015-09-12 DIAGNOSIS — Z7984 Long term (current) use of oral hypoglycemic drugs: Secondary | ICD-10-CM | POA: Diagnosis not present

## 2015-09-12 DIAGNOSIS — F329 Major depressive disorder, single episode, unspecified: Secondary | ICD-10-CM | POA: Diagnosis not present

## 2015-09-12 DIAGNOSIS — F05 Delirium due to known physiological condition: Secondary | ICD-10-CM | POA: Diagnosis not present

## 2015-09-12 DIAGNOSIS — R4182 Altered mental status, unspecified: Secondary | ICD-10-CM | POA: Diagnosis not present

## 2015-09-12 LAB — CULTURE, BLOOD (ROUTINE X 2)
CULTURE: NO GROWTH
CULTURE: NO GROWTH

## 2015-09-12 LAB — GLUCOSE, CAPILLARY
GLUCOSE-CAPILLARY: 111 mg/dL — AB (ref 65–99)
GLUCOSE-CAPILLARY: 237 mg/dL — AB (ref 65–99)

## 2015-09-12 MED ORDER — QUETIAPINE FUMARATE 25 MG PO TABS: 25.0000 mg | ORAL_TABLET | Freq: Three times a day (TID) | ORAL | 0 refills | Status: AC

## 2015-09-12 MED ORDER — QUETIAPINE FUMARATE 25 MG PO TABS: 25.0000 mg | ORAL_TABLET | Freq: Three times a day (TID) | ORAL | 0 refills | Status: DC | PRN

## 2015-09-12 MED ORDER — METHIMAZOLE 5 MG PO TABS: 15.0000 mg | ORAL_TABLET | Freq: Every day | ORAL | 0 refills | Status: AC

## 2015-09-12 NOTE — Progress Notes (Signed)
Patient will discharge to [facility] Anticipated discharge date: 7/25 Family notified: pt son Transportation by [employer]- called at 3:30pm  CSW signing off.  Merlyn Lot, LCSWA Clinical Social Worker [phone]

## 2015-09-12 NOTE — Discharge Summary (Addendum)
Physician Discharge Summary  Tamara Shaw ZOX:[id number] DOB: [birth year] DOA: 09/04/2015  PCP: Bjorn Loser, DO  Admit date: 09/04/2015 Discharge date: 09/12/2015  Time spent: 45 minutes  Recommendations for Outpatient Follow-up:  1. Complete hyperthyroid workup - thyroid scan NM 2. [facility]   Discharge Diagnoses:  Principal Problem:   Delirium Active Problems:   Diabetes mellitus without complication (HCC)   Essential hypertension   Hypothyroidism   Leukocytosis   Elevated WBC count   Generalized abdominal discomfort   Discharge Condition: stable  Diet recommendation: DM heart healthy  Filed Weights   09/05/15 0548  Weight: 74 kg (163 lb 1.6 oz)    History of present illness:  Chief Complaint: Confusion  HPI: Tamara Shaw is a 70 y.o. female with a past medical history significant for NIDDM, HTN, and hypothyroidism who presents with delirium.  All history collected from chart and ED MD as patient is unaccompanied and unable to provide her own history given her mental status.  Per chart, she lived near [place] alone until a few months ago, driving and managing all ADLs and IADLs independently.  During the last 3-4 months, she had had several falls (was evaluated once in the ER for "concussion") but was increasingly unsafe at home alone, and either family were staying with her or she had gone to stay with family until one week ago when she was admitted to [facility] for four days for delirium.  These records are not currently available, but son believes she may have had a UTI, may have had normal brain imaging.  Discharge instructions/paperwork from that hospitalization is available and shows she was started on quetiapine 25 mg BID for delirium in the setting of dementia and her alprazolam was lowered (she also was stopped on temazepam, glimepiride and HCTZ during that hospitalization).    She was discharged 4 days ago and  came to [facility] to stay with her son with plans to follow up with Neurology as outpatient, but he reported that she has been confused since then, with waxing and waning hyperactivity, hallucinations, confusion and disorientation.  Tonight, he found her hallucinating, naked in the living room, so he brought her to the ER.  ED course: -Afebrile, heart rate and respirations normal, mildly hypertensive, saturating well on room air -Na 142, K 3.8, Cr 0.9 (baseline unknown), WBC 12K, Hgb 13 -Alcohol negative -Urinalysis without pyuria or hematuria -UDS with benzodiazepines (prescribed) -CT head unremarkable -Case was discussed with neurology who recommended EEG and MRI and neuro consult, likely GeriPsych consult if indeed she has dementia   Hospital Course:  Patient admitted to [facility] to the medicine service for evaluation of altered mental status. Family stated that patient became acutely disoriented with hallucinations of seeing Jesus and [name]. EEG done on July 18 was normal. Patient also had a MRI done with no acute findings. See results below. Neurology and psychiatry were both consult. Psychiatry advised Seroquel for patient's agitation that she had early on in her stay. The help to calm patients on. Psychiatry signed off. Neurology workup included a manual lumbar puncture at bedside after failure and lumbar puncture done under fluoroscopy which also fell. This was done to possibly rule out Creutzfeldt-Jakob which neurology now feels is not on the differential. Patient also had negative RPR negative blood cultures. She did have leukocytosis early in the state that resolved on its own. She never any fevers. Patient at home did administer some overall  medication and had been on Synthroid. TSH was checked and she was found to be hypothyroid confirmed by free T4. Her patient had delirium due to medication induced hyperthyroidism. Medication was helpful in patients a very improved. She  still had hallucinations though. On later conversation with family patient has seen Jesus for several months now. But she had this acute date worsening the brought to the hospital. After talking to neurology patient may still weight gains and improvement with a likely has dementia with superimposed hyperthyroidism. Inflammatory markers were positive. This could be age-related or could be due to thyroiditis patient will need follow-up thyroid labs and possibly a nuclear medical scan after discharge. Methimazole started, will need endocrine follow-up at discharge.  Patient seen by occupational therapy and physical therapy who recommended SNF.  Procedures: 09/05/15 EEG Impression: This is an abnormal EEG due to generalized background slowing, indicative of diffuse cerebral dysfunction.  This is a nonspecific finding which may be due to toxic-metabolic etiology or other diffuse physiologic abnormality.  Consultations:  Psych, Neuro  Discharge Exam: Vitals:   09/12/15 0758 09/12/15 1012  BP: (!) 150/85 (!) 148/70  Pulse: 67 70  Resp:  20  Temp:  98 F (36.7 C)    General exam: Appears calm and comfortable Respiratory system: Clear to auscultation. Respiratory effort normal. Cardiovascular system: S1 & S2 heard, RRR. No JVD, murmurs, rubs, gallops or clicks. No pedal edema. Gastrointestinal system: Abdomen is nondistended, soft, NTTP . No organomegaly or masses felt. Normal bowel sounds heard. Central nervous system: Alert and oriented x2. No focal neurological deficits. Extremities: Symmetric 5 x 5 power. Skin: No rashes, lesions or ulcers Psychiatry/Neuro: Calm, patient easily redirected, Cooperative, speech less pressured, making single religious reference to [facility]  Discharge Instructions    Current Discharge Medication List    START taking these medications   Details  methimazole (TAPAZOLE) 5 MG tablet Take 3 tablets (15 mg total) by mouth daily. Qty: 30 tablet, Refills: 0       CONTINUE these medications which have CHANGED   Details  !! QUEtiapine (SEROQUEL) 25 MG tablet Take 1 tablet (25 mg total) by mouth 3 (three) times daily. Qty: 90 tablet, Refills: 0    !! QUEtiapine (SEROQUEL) 25 MG tablet Take 1 tablet (25 mg total) by mouth every 8 (eight) hours as needed (aggitation). Qty: 90 tablet, Refills: 0     !! - Potential duplicate medications found. Please discuss with provider.    CONTINUE these medications which have NOT CHANGED   Details  albuterol (PROVENTIL HFA;VENTOLIN HFA) 108 (90 Base) MCG/ACT inhaler Inhale 1-2 puffs into the lungs every 4 (four) hours as needed for wheezing or shortness of breath.    carvedilol (COREG) 6.25 MG tablet Take 6.25 mg by mouth 2 (two) times daily with a meal.    FLUoxetine (PROZAC) 20 MG capsule Take 20 mg by mouth daily.    lisinopril (PRINIVIL,ZESTRIL) 5 MG tablet Take 5 mg by mouth daily.    metFORMIN (GLUCOPHAGE) 1000 MG tablet Take 500 mg by mouth 2 (two) times daily with a meal.    pravastatin (PRAVACHOL) 80 MG tablet Take 80 mg by mouth daily.    vitamin B-12 (CYANOCOBALAMIN) 1000 MCG tablet Take 1,000 mcg by mouth daily.    aspirin EC 81 MG tablet Take 81 mg by mouth daily.      STOP taking these medications     ALPRAZolam (XANAX) 0.25 MG tablet      levothyroxine (SYNTHROID, LEVOTHROID) 75 MCG  tablet        No Known Allergies    The results of significant diagnostics from this hospitalization (including imaging, microbiology, ancillary and laboratory) are listed below for reference.    Significant Diagnostic Studies: Ct Head Wo Contrast  Result Date: 09/05/2015 CLINICAL DATA:  Fall with altered mental status. Initial encounter. EXAM: CT HEAD WITHOUT CONTRAST TECHNIQUE: Contiguous axial images were obtained from the base of the skull through the vertex without intravenous contrast. COMPARISON:  None. FINDINGS: Brain: No evidence of acute infarction, hemorrhage, hydrocephalus, or mass  lesion/mass effect. Mild patchy microvascular ischemic change in the cerebral white matter. Normal cerebral volume for age. Vascular: Atherosclerotic calcification. Skull: Negative for fracture or focal lesion. Sinuses/Orbits: Bilateral cataract resection. Notable large leftward nasal septal spur with inferior turbinate deformation. Other: None. IMPRESSION: 1. No acute finding. 2. Mild chronic white matter disease. Electronically Signed   By: Marnee Spring M.D.   On: 09/05/2015 00:18   Mr Laqueta Jean XY Contrast  Result Date: 09/08/2015 CLINICAL DATA:  Rapidly progressive dementia. History of hypertension, diabetes. EXAM: MRI HEAD WITHOUT AND WITH CONTRAST TECHNIQUE: Multiplanar, multiecho pulse sequences of the brain and surrounding structures were obtained without and with intravenous contrast. CONTRAST:  74mL MULTIHANCE GADOBENATE DIMEGLUMINE 529 MG/ML IV SOLN COMPARISON:  CT HEAD September 04, 2015 FINDINGS: Moderately motion degraded examination, rapid sequences used. INTRACRANIAL CONTENTS: No reduced diffusion to suggest acute ischemia. No susceptibility artifact to suggest hemorrhage. The ventricles and sulci are normal for patient's age. Mild symmetric T2 hyperintense signal in a globus pallidus and to lesser extent LEFT putaminal. No definite abnormal signal within the mesial thalamus. Patchy supratentorial and pontine white matter FLAIR T2 hyperintensities. No suspicious parenchymal signal, mass lesions, mass effect. No abnormal intraparenchymal or extra-axial enhancement. No abnormal extra-axial fluid collections. No extra-axial masses. Normal major intracranial vascular flow voids present at skull base. ORBITS: The included ocular globes and orbital contents are non-suspicious. Status post bilateral ocular lens implants. SINUSES: The mastoid air-cells and included paranasal sinuses are well-aerated. SKULL/SOFT TISSUES: No abnormal sellar expansion. No suspicious calvarial bone marrow signal. Craniocervical  junction maintained. ACDF resultant susceptibility artifact. IMPRESSION: No acute intracranial process on this motion degraded examination. Symmetric abnormal signal within the bilateral globus pallidus which can be associated with liver dysfunction, prior toxic insult, neuro degeneration associated with iron deposition. Involutional changes.  Mild chronic small vessel ischemic disease. Electronically Signed   By: Awilda Metro M.D.   On: 09/08/2015 22:31   Dg Chest Port 1 View  Result Date: 09/06/2015 CLINICAL DATA:  Leukocytosis EXAM: PORTABLE CHEST 1 VIEW COMPARISON:  None. FINDINGS: Surgical hardware is partially visualized overlying the lower cervical spine. Normal heart size. Atherosclerotic aortic arch. Otherwise normal mediastinal contour. No pneumothorax. No pleural effusion. Lungs appear clear, with no acute consolidative airspace disease and no pulmonary edema. IMPRESSION: No active disease. Electronically Signed   By: Delbert Phenix M.D.   On: 09/06/2015 12:57   Dg Abd 2 Views  Result Date: 09/07/2015 CLINICAL DATA:  Lower abdominal pain comment nausea, vomiting and diarrhea. No time course given. EXAM: ABDOMEN - 2 VIEW COMPARISON:  None. FINDINGS: The lung bases are grossly clear. There is moderate stool throughout the colon and down into the rectum. Scattered air-filled small bowel loops without distention or air-fluid levels to suggest obstruction. No free air. The soft tissue shadows are maintained. Lumbar fusion hardware noted. Extensive vascular calcifications with this iliac artery stents. IMPRESSION: No plain film findings for an acute abdominal  process. No findings for obstruction or perforation. Electronically Signed   By: Rudie Meyer M.D.   On: 09/07/2015 12:33    Microbiology: Recent Results (from the past 240 hour(s))  Culture, blood (Routine X 2) w Reflex to ID Panel     Status: None (Preliminary result)   Collection Time: 09/07/15  8:10 PM  Result Value Ref Range Status    Specimen Description BLOOD LEFT ANTECUBITAL  Final   Special Requests IN PEDIATRIC BOTTLE 3CC  Final   Culture NO GROWTH 4 DAYS  Final   Report Status PENDING  Incomplete  Culture, blood (Routine X 2) w Reflex to ID Panel     Status: None (Preliminary result)   Collection Time: 09/07/15  8:25 PM  Result Value Ref Range Status   Specimen Description BLOOD LEFT HAND  Final   Special Requests BOTTLES DRAWN AEROBIC ONLY 5CC  Final   Culture NO GROWTH 4 DAYS  Final   Report Status PENDING  Incomplete     Labs: Basic Metabolic Panel:  Recent Labs Lab 09/07/15 0544 09/08/15 0943 09/09/15 1347 09/10/15 0430 09/11/15 0528  NA 139 138 138 139 140  K 3.6 4.4 3.9 4.1 3.7  CL 106 107 105 109 107  CO2 25 21* GLUCOSE 162* 175* 259* 146* 116*  BUN 16 18 28* 29* 17  CREATININE 0.82 0.85 1.23* 0.90 0.81  CALCIUM 9.8 9.4 9.0 9.4 9.4  PHOS 3.4  --   --   --   --    Liver Function Tests:  Recent Labs Lab 09/07/15 0544  ALBUMIN 3.6   No results for input(s): LIPASE, AMYLASE in the last 168 hours.  Recent Labs Lab 09/07/15 2010  AMMONIA 33   CBC:  Recent Labs Lab 09/08/15 0943 09/09/15 1347 09/10/15 0430 09/11/15 0528  WBC 10.0 9.4 9.8 8.3  NEUTROABS 6.3 5.4 4.9 3.6  HGB 13.4 12.6 12.5 12.4  HCT 40.1 38.1 38.6 37.5  MCV 93.3 94.3 93.5 93.1  PLT 250 264 272 268   Cardiac Enzymes: No results for input(s): CKTOTAL, CKMB, CKMBINDEX, TROPONINI in the last 168 hours. BNP: BNP (last 3 results) No results for input(s): BNP in the last 8760 hours.  ProBNP (last 3 results) No results for input(s): PROBNP in the last 8760 hours.  CBG:  Recent Labs Lab 09/11/15 1046 09/11/15 1649 09/11/15 2104 09/12/15 0641 09/12/15 1140  GLUCAP 215* 200* 176* 111* 237*       Signed:  Haydee Salter MD  FACP  [facility] 09/12/2015, 3:06 PM

## 2015-09-12 NOTE — Progress Notes (Signed)
[facility] is able to accept patient when stable for DC  Merlyn Lot, [facility] Clinical Social Worker [phone]

## 2015-09-12 NOTE — Clinical Social Work Placement (Signed)
   CLINICAL SOCIAL WORK PLACEMENT  NOTE  Date:  09/12/2015  Patient Details  Name: Tamara Shaw MRN: [id number] Date of Birth: 1946-01-07  Clinical Social Work is seeking post-discharge placement for this patient at the Skilled  Nursing Facility level of care (*CSW will initial, date and re-position this form in  chart as items are completed):      Patient/family provided with [facility] Clinical Social Work Department's list of facilities offering this level of care within the geographic area requested by the patient (or if unable, by the patient's family).      Patient/family informed of their freedom to choose among providers that offer the needed level of care, that participate in Medicare, Medicaid or managed care program needed by the patient, have an available bed and are willing to accept the patient.      Patient/family informed of [facility]'s ownership interest in [facility] and [facility], as well as of the fact that they are under no obligation to receive care at these facilities.  PASRR submitted to EDS on 09/07/15     PASRR number received on       Existing PASRR number confirmed on       FL2 transmitted to all facilities in geographic area requested by pt/family on 09/07/15     FL2 transmitted to all facilities within larger geographic area on       Patient informed that his/her managed care company has contracts with or will negotiate with certain facilities, including the following:        Yes   Patient/family informed of bed offers received.  Patient chooses bed at [facility]     Physician recommends and patient chooses bed at      Patient to be transferred to [facility] on 09/12/15.  Patient to be transferred to facility by ptar     Patient family notified on 09/12/15 of transfer.  Name of family member notified:  Dorinda Hill     PHYSICIAN       Additional Comment:     _______________________________________________ Izora Ribas, LCSW 09/12/2015, 3:33 PM

## 2015-09-12 NOTE — Care Management Note (Signed)
Case Management Note  Patient Details  Name: Tamara Shaw MRN: [id number] Date of Birth: [birth year]  Subjective/Objective:                    Action/Plan: Plan is for patient to discharge to [facility] today. No further needs per CM.   Expected Discharge Date:                  Expected Discharge Plan:  Skilled Nursing Facility  In-House Referral:  Clinical Social Work  Discharge planning Services  CM Consult  Post Acute Care Choice:    Choice offered to:     DME Arranged:    DME Agency:     HH Arranged:    HH Agency:     Status of Service:  Completed, signed off  If discussed at [employer], dates discussed:    Additional Comments:  Kermit Balo, RN 09/12/2015, 10:34 AM

## 2015-09-12 NOTE — Progress Notes (Signed)
Physical Therapy Treatment Patient Details Name: Tamara Shaw MRN: [id number] DOB: 07-21-1945 Today's Date: 09/12/2015    History of Present Illness Tamara Shaw is a 70 y.o. female patient with history of depression, unspecified cognitive disorder, hypothyroidism, diabetes, hypertension who is consulted for hallucinations.     PT Comments    Progressing steadily cognitively, but still needing much direction/redirection.  Mobility remains mildly unsteady overall.  Follow Up Recommendations  SNF     Equipment Recommendations  None recommended by PT    Recommendations for Other Services       Precautions / Restrictions Precautions Precautions: Fall    Mobility  Bed Mobility Overal bed mobility: Needs Assistance Bed Mobility: Supine to Sit;Sit to Supine     Supine to sit: Min guard Sit to supine: Min guard   General bed mobility comments: cues for guidance and redirection  Transfers Overall transfer level: Needs assistance Equipment used: 1 person hand held assist Transfers: Sit to/from Stand Sit to Stand: Min guard Stand pivot transfers: Min guard       General transfer comment: cues still mostly for redirection   Ambulation/Gait Ambulation/Gait assistance: Min assist;Min guard Ambulation Distance (Feet): 200 Feet Assistive device: 1 person hand held assist Gait Pattern/deviations: Step-through pattern     General Gait Details: hold assist for guidance and stability. Pt still not able to stay focused on task and needs frequent redirection.  Mildly unsteady overall.   Stairs            Wheelchair Mobility    Modified Rankin (Stroke Patients Only)       Balance Overall balance assessment: Needs assistance   Sitting balance-Leahy Scale: Good       Standing balance-Leahy Scale: Fair                      Cognition Arousal/Alertness: Awake/alert Behavior During Therapy: Flat affect Overall Cognitive Status:  Impaired/Different from baseline   Orientation Level: Disoriented to;Person;Place;Time;Situation Current Attention Level: Sustained Memory: Decreased recall of precautions;Decreased short-term memory Following Commands: Follows one step commands with increased time;Follows one step commands inconsistently Safety/Judgement: Decreased awareness of deficits;Decreased awareness of safety     General Comments: Understanding what pt saying today, but pt not always making sense.    Exercises      General Comments        Pertinent Vitals/Pain Pain Assessment: No/denies pain    Home Living                      Prior Function            PT Goals (current goals can now be found in the care plan section) Acute Rehab PT Goals Patient Stated Goal: none stated PT Goal Formulation: Patient unable to participate in goal setting Time For Goal Achievement: 09/13/15 Potential to Achieve Goals: Fair Progress towards PT goals: Progressing toward goals    Frequency  Min 3X/week    PT Plan Current plan remains appropriate    Co-evaluation             End of Session   Activity Tolerance: Patient tolerated treatment well Patient left: in bed;with bed alarm set;with call bell/phone within reach     Time: 1102-1130 PT Time Calculation (min) (ACUTE ONLY): 28 min  Charges:  $Gait Training: 8-22 mins $Therapeutic Activity: 8-22 mins                    G  Codes:      MottingerEliseo Gum 09/12/2015, 11:45 AM 09/12/2015  [place] Bing, PT [phone]  (pager)

## 2015-09-14 ENCOUNTER — Emergency Department
Admission: EM | Admit: 2015-09-14 | Discharge: 2015-09-15 | Payer: Medicare Other | Attending: Emergency Medicine | Admitting: Emergency Medicine

## 2015-09-14 ENCOUNTER — Encounter: Payer: Self-pay | Admitting: Emergency Medicine

## 2015-09-14 DIAGNOSIS — I10 Essential (primary) hypertension: Secondary | ICD-10-CM | POA: Diagnosis not present

## 2015-09-14 DIAGNOSIS — Z7984 Long term (current) use of oral hypoglycemic drugs: Secondary | ICD-10-CM | POA: Insufficient documentation

## 2015-09-14 DIAGNOSIS — E119 Type 2 diabetes mellitus without complications: Secondary | ICD-10-CM | POA: Diagnosis not present

## 2015-09-14 DIAGNOSIS — F23 Brief psychotic disorder: Secondary | ICD-10-CM | POA: Insufficient documentation

## 2015-09-14 DIAGNOSIS — Z7982 Long term (current) use of aspirin: Secondary | ICD-10-CM | POA: Insufficient documentation

## 2015-09-14 DIAGNOSIS — E039 Hypothyroidism, unspecified: Secondary | ICD-10-CM | POA: Insufficient documentation

## 2015-09-14 DIAGNOSIS — Z021 Encounter for pre-employment examination: Secondary | ICD-10-CM | POA: Diagnosis not present

## 2015-09-14 DIAGNOSIS — F29 Unspecified psychosis not due to a substance or known physiological condition: Secondary | ICD-10-CM | POA: Diagnosis not present

## 2015-09-14 DIAGNOSIS — Z79899 Other long term (current) drug therapy: Secondary | ICD-10-CM | POA: Insufficient documentation

## 2015-09-14 DIAGNOSIS — F329 Major depressive disorder, single episode, unspecified: Secondary | ICD-10-CM | POA: Insufficient documentation

## 2015-09-14 LAB — COMPREHENSIVE METABOLIC PANEL WITH GFR
ALT: 23 U/L (ref 14–54)
AST: 33 U/L (ref 15–41)
Albumin: 3.7 g/dL (ref 3.5–5.0)
Alkaline Phosphatase: 77 U/L (ref 38–126)
Anion gap: 9 (ref 5–15)
BUN: 30 mg/dL — ABNORMAL HIGH (ref 6–20)
CO2: 25 mmol/L (ref 22–32)
Calcium: 9.7 mg/dL (ref 8.9–10.3)
Chloride: 104 mmol/L (ref 101–111)
Creatinine, Ser: 1.24 mg/dL — ABNORMAL HIGH (ref 0.44–1.00)
GFR calc Af Amer: 49 mL/min — ABNORMAL LOW (ref >60)
GFR calc non Af Amer: 42 mL/min — ABNORMAL LOW (ref >60)
Glucose, Bld: 136 mg/dL — ABNORMAL HIGH (ref 65–99)
Potassium: 4.6 mmol/L (ref 3.5–5.1)
Sodium: 138 mmol/L (ref 135–145)
Total Bilirubin: 0.4 mg/dL (ref 0.3–1.2)
Total Protein: 6.6 g/dL (ref 6.5–8.1)

## 2015-09-14 LAB — CBC
HEMATOCRIT: 37.5 % (ref 35.0–47.0)
HEMOGLOBIN: 13.2 g/dL (ref 12.0–16.0)
MCH: 32.9 pg (ref 26.0–34.0)
MCHC: 35.1 g/dL (ref 32.0–36.0)
MCV: 93.7 fL (ref 80.0–100.0)
Platelets: 277 10*3/uL (ref 150–440)
RBC: 4 MIL/uL (ref 3.80–5.20)
RDW: 13.5 % (ref 11.5–14.5)
WBC: 10.6 10*3/uL (ref 3.6–11.0)

## 2015-09-14 LAB — GLUCOSE, CAPILLARY: Glucose-Capillary: 268 mg/dL — ABNORMAL HIGH (ref 65–99)

## 2015-09-14 LAB — TSH: TSH: 0.784 u[IU]/mL (ref 0.350–4.500)

## 2015-09-14 LAB — SALICYLATE LEVEL

## 2015-09-14 LAB — ETHANOL

## 2015-09-14 LAB — ACETAMINOPHEN LEVEL: Acetaminophen (Tylenol), Serum: <10 ug/mL — ABNORMAL LOW (ref 10–30)

## 2015-09-14 LAB — T4, FREE: Free T4: 0.46 ng/dL — ABNORMAL LOW (ref 0.61–1.12)

## 2015-09-14 MED ORDER — PRAVASTATIN SODIUM 40 MG PO TABS: 80.0000 mg | ORAL_TABLET | Freq: Every day | ORAL | Status: DC

## 2015-09-14 MED ORDER — CARVEDILOL 6.25 MG PO TABS
6.2500 mg | ORAL_TABLET | Freq: Two times a day (BID) | ORAL | Status: DC
  Administered 2015-09-15: 6.25 mg via ORAL
  Filled 2015-09-14: qty 1

## 2015-09-14 MED ORDER — INSULIN ASPART 100 UNIT/ML ~~LOC~~ SOLN
0.0000 [IU] | Freq: Every day | SUBCUTANEOUS | Status: DC
  Administered 2015-09-14: 3 [IU] via SUBCUTANEOUS
  Filled 2015-09-14: qty 3

## 2015-09-14 MED ORDER — METHIMAZOLE 5 MG PO TABS
15.0000 mg | ORAL_TABLET | Freq: Every day | ORAL | Status: DC
  Administered 2015-09-14 – 2015-09-15 (×2): 15 mg via ORAL
  Filled 2015-09-14 (×2): qty 1

## 2015-09-14 MED ORDER — FLUOXETINE HCL 20 MG PO CAPS
20.0000 mg | ORAL_CAPSULE | Freq: Every day | ORAL | Status: DC
  Administered 2015-09-14 – 2015-09-15 (×2): 20 mg via ORAL
  Filled 2015-09-14 (×2): qty 1

## 2015-09-14 MED ORDER — ALBUTEROL SULFATE HFA 108 (90 BASE) MCG/ACT IN AERS
2.0000 | INHALATION_SPRAY | RESPIRATORY_TRACT | Status: DC | PRN
  Filled 2015-09-14: qty 6.7

## 2015-09-14 MED ORDER — LISINOPRIL 5 MG PO TABS
5.0000 mg | ORAL_TABLET | Freq: Every day | ORAL | Status: DC
  Administered 2015-09-14 – 2015-09-15 (×2): 5 mg via ORAL
  Filled 2015-09-14 (×2): qty 1

## 2015-09-14 MED ORDER — ASPIRIN EC 81 MG PO TBEC
81.0000 mg | DELAYED_RELEASE_TABLET | Freq: Every day | ORAL | Status: DC
  Administered 2015-09-14 – 2015-09-15 (×2): 81 mg via ORAL
  Filled 2015-09-14 (×2): qty 1

## 2015-09-14 MED ORDER — QUETIAPINE FUMARATE 25 MG PO TABS
25.0000 mg | ORAL_TABLET | Freq: Three times a day (TID) | ORAL | Status: DC
  Administered 2015-09-14 – 2015-09-15 (×2): 25 mg via ORAL
  Filled 2015-09-14 (×2): qty 1

## 2015-09-14 MED ORDER — INSULIN ASPART 100 UNIT/ML ~~LOC~~ SOLN
0.0000 [IU] | Freq: Three times a day (TID) | SUBCUTANEOUS | Status: DC
  Administered 2015-09-15: 3 [IU] via SUBCUTANEOUS
  Administered 2015-09-15: 2 [IU] via SUBCUTANEOUS
  Filled 2015-09-14: qty 3
  Filled 2015-09-14: qty 2

## 2015-09-14 MED ORDER — ALBUTEROL SULFATE HFA 108 (90 BASE) MCG/ACT IN AERS: 2.0000 | INHALATION_SPRAY | RESPIRATORY_TRACT | Status: DC | PRN

## 2015-09-14 MED ORDER — METFORMIN HCL 500 MG PO TABS
500.0000 mg | ORAL_TABLET | Freq: Two times a day (BID) | ORAL | Status: DC
  Administered 2015-09-15: 500 mg via ORAL
  Filled 2015-09-14: qty 1

## 2015-09-14 MED ORDER — VITAMIN B-12 1000 MCG PO TABS
1000.0000 ug | ORAL_TABLET | Freq: Every day | ORAL | Status: DC
  Administered 2015-09-14 – 2015-09-15 (×2): 1000 ug via ORAL
  Filled 2015-09-14 (×3): qty 1

## 2015-09-14 NOTE — Progress Notes (Signed)
TTS attempted to interview Tamara Shaw.  She appeared disoriented and was unable to provide information regarding her care or history.

## 2015-09-14 NOTE — Consult Note (Signed)
[facility] Face-to-Face Psychiatry Consult   Reason for Consult:  Consult for this 70 year old woman sent from her living facility with reports that she had been aggressive. Referring Physician:  Karma Greaser Patient Identification: Tamara Shaw MRN:  [id number] Principal Diagnosis: Psychosis Diagnosis:   Patient Active Problem List   Diagnosis Date Noted  . Psychosis [F29] 09/14/2015  . Elevated WBC count [D72.829]   . Generalized abdominal discomfort [R10.84]   . Delirium [R41.0] 09/05/2015  . Diabetes mellitus without complication ([place]) [E72.0] 09/05/2015  . Essential hypertension [I10] 09/05/2015  . Hypothyroidism [E03.9] 09/05/2015  . Leukocytosis [D72.829] 09/05/2015    Total Time spent with patient: 1 hour  Subjective:   Tamara Shaw is a 70 y.o. female patient admitted with "I am dead".  HPI:  Patient interviewed. Chart reviewed including her recent admission at [facility] and vitals reviewed. I attempted to call her son on the phone but there was no answer. 70 year old woman sent here from her assisted living facility with reports that she had been aggressive. Patient was not able to offer much in the way of useful history. She was clearly awake and alert and attentive during our interview but her speech was very limited. She told me at the beginning of the interview that she had recently died and that when she died "I was in excruciating pain". She told me that now that she has died she is no longer having any pain. Patient answers most other questions by either shrugging her shoulders or making a variety of odd gestures with her hands that seem reminiscent of sign language. I don't think however that she is deaf as she is able to understand what I'm saying quite clearly and when she does speak it is in a quiet and articulate tone. Patient told me the only other thing she wanted to let me know was the name of her son "with whom I am most pleased". She did correctly tell  me the name of her son as it is listed in the chart. Patient apparently was just at [facility] recently and was discharged only about 2 days ago. She was there for what was called a delirium and received an extensive workup which didn't reveal any clear pathology except for hyper thyroid. They appear to have reached the conclusion that she was delirious from being hyperthyroid..  Social history: My understanding from reading her notes at [facility] is that she had previously been living in [place] and had been at least semi-independent. She was apparently hospitalized there for some period of time and then [place] her [place] and now hospitalized here. The son appears to be her closest relative.  Medical history: Patient wasn't able to give me much information. Patient clearly has hypertension and diabetes and recently was diagnosed as hyperthyroid. I am a little bit puzzled by that. Reviewing all of her thyroid tests over the recent hospitalization none of them seemed all that dramatically high or low. It doesn't seem really that clear to me that she was clinically hyperthyroid. Perhaps this needs to be looked into more clearly. Her current lab test show a low thyroid level.  Substance abuse history: No evidence or past history reported of any substance abuse  Past Psychiatric History: The notes from [facility] reports that her family said that she had started to act strangely and talk about delusions of being visited by [place] and Cristino Martes for  several months. Prior to that we don't know any other psychiatric history. I had hoped to talk to her son but have not been able to reach him. Evidently psychiatric consult at [facility] thought that all of this was delirium.  Risk to Self:   Risk to Others:   Prior Inpatient Therapy:   Prior Outpatient Therapy:    Past Medical History:  Past Medical History:  Diagnosis Date  . Dementia   . Depression    . Diabetes mellitus without complication ([place])   . Hypertension   . Thyroid disease    hypothyroid   History reviewed. No pertinent surgical history. Family History:  Family History  Problem Relation Age of Onset  . Hypertension Mother   . Hypertension Father    Family Psychiatric  History: Unknown Social History:  History  Alcohol Use No     History  Drug Use No    Social History   Social History  . Marital status: Married    Spouse name: N/A  . Number of children: N/A  . Years of education: N/A   Social History Main Topics  . Smoking status: Never Smoker  . Smokeless tobacco: None  . Alcohol use No  . Drug use: No  . Sexual activity: Not Asked   Other Topics Concern  . None   Social History Narrative  . None   Additional Social History:    Allergies:  No Known Allergies  Labs:  Results for orders placed or performed during the hospital encounter of 09/14/15 (from the past 48 hour(s))  Comprehensive metabolic panel     Status: Abnormal   Collection Time: 09/14/15  2:04 PM  Result Value Ref Range   Sodium 138 135 - 145 mmol/L   Potassium 4.6 3.5 - 5.1 mmol/L   Chloride 104 101 - 111 mmol/L   CO2 25 22 - 32 mmol/L   Glucose, Bld 136 (H) 65 - 99 mg/dL   BUN 30 (H) 6 - 20 mg/dL   Creatinine, Ser 1.24 (H) 0.44 - 1.00 mg/dL   Calcium 9.7 8.9 - 10.3 mg/dL   Total Protein 6.6 6.5 - 8.1 g/dL   Albumin 3.7 3.5 - 5.0 g/dL   AST 33 15 - 41 U/L   ALT 23 14 - 54 U/L   Alkaline Phosphatase 77 38 - 126 U/L   Total Bilirubin 0.4 0.3 - 1.2 mg/dL   GFR calc non Af Amer 42 (L) >60 mL/min   GFR calc Af Amer 49 (L) >60 mL/min    Comment: (NOTE) The eGFR has been calculated using the CKD EPI equation. This calculation has not been validated in all clinical situations. eGFR's persistently <60 mL/min signify possible Chronic Kidney Disease.    Anion gap 9 5 - 15  Ethanol     Status: None   Collection Time: 09/14/15  2:04 PM  Result Value Ref Range   Alcohol,  Ethyl (B) <5 <5 mg/dL    Comment:        LOWEST DETECTABLE LIMIT FOR SERUM ALCOHOL IS 5 mg/dL FOR MEDICAL PURPOSES ONLY   cbc     Status: None   Collection Time: 09/14/15  2:04 PM  Result Value Ref Range   WBC 10.6 3.6 - 11.0 K/uL   RBC 4.00 3.80 - 5.20 MIL/uL   Hemoglobin 13.2 12.0 - 16.0 g/dL   HCT 37.5 35.0 - 47.0 %   MCV 93.7 80.0 - 100.0 fL   MCH 32.9 26.0 - 34.0 pg  MCHC 35.1 32.0 - 36.0 g/dL   RDW 13.5 11.5 - 14.5 %   Platelets 277 284 - 132 K/uL  Salicylate level     Status: None   Collection Time: 09/14/15  2:04 PM  Result Value Ref Range   Salicylate Lvl <4.4 2.8 - 30.0 mg/dL  Acetaminophen level     Status: Abnormal   Collection Time: 09/14/15  2:04 PM  Result Value Ref Range   Acetaminophen (Tylenol), Serum <10 (L) 10 - 30 ug/mL    Comment:        THERAPEUTIC CONCENTRATIONS VARY SIGNIFICANTLY. A RANGE OF 10-30 ug/mL MAY BE AN EFFECTIVE CONCENTRATION FOR MANY PATIENTS. HOWEVER, SOME ARE BEST TREATED AT CONCENTRATIONS OUTSIDE THIS RANGE. ACETAMINOPHEN CONCENTRATIONS >150 ug/mL AT 4 HOURS AFTER INGESTION AND >50 ug/mL AT 12 HOURS AFTER INGESTION ARE OFTEN ASSOCIATED WITH TOXIC REACTIONS.   TSH     Status: None   Collection Time: 09/14/15  2:04 PM  Result Value Ref Range   TSH 0.784 0.350 - 4.500 uIU/mL  T4, free     Status: Abnormal   Collection Time: 09/14/15  2:04 PM  Result Value Ref Range   Free T4 0.46 (L) 0.61 - 1.12 ng/dL    Comment: (NOTE) Biotin ingestion may interfere with free T4 tests. If the results are inconsistent with the TSH level, previous test results, or the clinical presentation, then consider biotin interference. If needed, order repeat testing after stopping biotin.     Current Facility-Administered Medications  Medication Dose Route Frequency Provider Last Rate Last Dose  . albuterol (PROVENTIL HFA;VENTOLIN HFA) 108 (90 Base) MCG/ACT inhaler 2 puff  2 puff Inhalation Q4H PRN Gonzella Lex, MD      . aspirin EC tablet 81 mg   81 mg Oral Daily Gonzella Lex, MD      . Derrill Memo ON 09/15/2015] carvedilol (COREG) tablet 6.25 mg  6.25 mg Oral BID WC Gonzella Lex, MD      . FLUoxetine (PROZAC) capsule 20 mg  20 mg Oral Daily Gonzella Lex, MD      . Derrill Memo ON 09/15/2015] insulin aspart (novoLOG) injection 0-15 Units  0-15 Units Subcutaneous TID WC  T , MD      . insulin aspart (novoLOG) injection 0-5 Units  0-5 Units Subcutaneous QHS Gonzella Lex, MD      . lisinopril (PRINIVIL,ZESTRIL) tablet 5 mg  5 mg Oral Daily Gonzella Lex, MD      . Derrill Memo ON 09/15/2015] metFORMIN (GLUCOPHAGE) tablet 500 mg  500 mg Oral BID WC Gonzella Lex, MD      . methimazole (TAPAZOLE) tablet 15 mg  15 mg Oral Daily Gonzella Lex, MD      . Derrill Memo ON 09/15/2015] pravastatin (PRAVACHOL) tablet 80 mg  80 mg Oral q1800 Gonzella Lex, MD      . QUEtiapine (SEROQUEL) tablet 25 mg  25 mg Oral TID Gonzella Lex, MD      . vitamin B-12 (CYANOCOBALAMIN) tablet 1,000 mcg  1,000 mcg Oral Daily Gonzella Lex, MD       Current Outpatient Prescriptions  Medication Sig Dispense Refill  . albuterol (PROVENTIL HFA;VENTOLIN HFA) 108 (90 Base) MCG/ACT inhaler Inhale 1-2 puffs into the lungs every 4 (four) hours as needed for wheezing or shortness of breath.    Marland Kitchen aspirin EC 81 MG tablet Take 81 mg by mouth daily.    . carvedilol (COREG) 6.25 MG tablet Take 6.25 mg by  mouth 2 (two) times daily with a meal.    . FLUoxetine (PROZAC) 20 MG capsule Take 20 mg by mouth daily.    Marland Kitchen lisinopril (PRINIVIL,ZESTRIL) 5 MG tablet Take 5 mg by mouth daily.    . metFORMIN (GLUCOPHAGE) 1000 MG tablet Take 500 mg by mouth 2 (two) times daily with a meal.    . methimazole (TAPAZOLE) 5 MG tablet Take 3 tablets (15 mg total) by mouth daily. 30 tablet 0  . pravastatin (PRAVACHOL) 80 MG tablet Take 80 mg by mouth daily.    . QUEtiapine (SEROQUEL) 25 MG tablet Take 1 tablet (25 mg total) by mouth 3 (three) times daily. 90 tablet 0  . QUEtiapine (SEROQUEL) 25 MG tablet  Take 1 tablet (25 mg total) by mouth every 8 (eight) hours as needed (aggitation). 90 tablet 0  . vitamin B-12 (CYANOCOBALAMIN) 1000 MCG tablet Take 1,000 mcg by mouth daily.      Musculoskeletal: Strength & Muscle Tone: within normal limits Gait & Station: normal Patient leans: N/A  Psychiatric Specialty Exam: Physical Exam  Nursing note and vitals reviewed. Constitutional: She appears well-developed and well-nourished.  HENT:  Head: Normocephalic and atraumatic.  Eyes: Conjunctivae are normal. Pupils are equal, round, and reactive to light.  Neck: Normal range of motion.  Cardiovascular: Regular rhythm and normal heart sounds.   Respiratory: Effort normal. No respiratory distress.  GI: Soft.  Musculoskeletal: Normal range of motion.  Neurological: She is alert.  Skin: Skin is warm and dry.  Psychiatric: Her affect is blunt and inappropriate. She is slowed. Thought content is delusional. Cognition and memory are impaired. She expresses inappropriate judgment. She is noncommunicative.    Review of Systems  Unable to perform ROS: Mental status change    Blood pressure (!) 91/55, pulse 72, temperature 97.7 F (36.5 C), resp. rate 18, weight 73.9 kg (163 lb), SpO2 97 %.Body mass index is 29.81 kg/m.  General Appearance: Disheveled  Eye Contact:  Fair  Speech:  Speech was almost nonexistent although she did answer a few questions and when she did it was in clearly understandable words. Start her conversation was in the form of odd hand gestures  Volume:  Decreased  Mood:  When I ask her this she was not able to give me an articulate answer but made some faces and some hand gestures.  Affect:  Constricted  Thought Process:  Irrelevant  Orientation:  Negative  Thought Content:  Negative  Suicidal Thoughts:  No  Homicidal Thoughts:  She did not say anything about hurting herself and there is no evidence that she tried to hurt herself although she does seem to have delusions of Artie  being dead.  Memory:  Negative  Judgement:  Impaired  Insight:  Lacking  Psychomotor Activity:  Decreased  Concentration:  Concentration: Poor  Recall:  Poor  Fund of Knowledge:  Poor  Language:  Poor  Akathisia:  No  Handed:  Right  AIMS (if indicated):     Assets:  Social Support  ADL's:  Impaired  Cognition:  Impaired,  Mild  Sleep:        Treatment Plan Summary: Daily contact with patient to assess and evaluate symptoms and progress in treatment, Medication management and Plan Despite the recent diagnosis of delirium everything about this patient to my examination seems more like psychosis. Patient was awake alert and attentive and appeared to be interacting with me throughout the interview, it was just that her behavior was not communicative and was  bizarre. She made statements about being dead. She reportedly has been making statements about having Cristino Martes and Jesus visit her at her room. All of this sounds much more like psychosis and delirium to me. Apparently she had a full workup neurologically at [facility] and nothing was found except for the slightly abnormal thyroid tests. 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 I'm going to continue her current medicine. Case reviewed with TTS and emergency room doctor. Orders done for admission to psychiatry as the patient is fully ambulatory and appears able to be managed on the psychiatric unit. When necessary medicines done. Labs will be fully evaluated.  Disposition: Recommend psychiatric Inpatient admission when medically cleared. Supportive therapy provided about ongoing stressors.  Alethia Berthold, MD 09/14/2015 6:27 PM

## 2015-09-14 NOTE — ED Triage Notes (Addendum)
Pt arrived via [facility] for IVC from [employer]. Staff report last night pt attempted to hurt herself with a stapler. Staff report that pt threw objects at other residents and staff. Staff report that throughout the night pt was yelling that she was going to kill herself. Pt denies SI and HI in triage. Pt states she is in a hospital but does not know why she is here. Pt cooperative in triage.

## 2015-09-14 NOTE — ED Notes (Signed)
Pt clothing removed and pt dressed in paper scrubs by this nurse and Mardene Celeste, NT. Pt bloodwork completed. Pt clothing bagged and labeled. Pt did not have any jewelry or other valuables with them in triage.

## 2015-09-14 NOTE — ED Notes (Addendum)
Called pharmacy, told they would send two more medications for 6:30.

## 2015-09-14 NOTE — ED Notes (Signed)
Pt. Son came to visit pt.  Pt. Son gave  A written note of her medical condition he has wittessed in the last couple months.

## 2015-09-14 NOTE — ED Provider Notes (Signed)
[facility] Emergency Department Provider Note  ____________________________________________   First MD Initiated Contact with Patient 09/14/15 1503     (approximate)  I have reviewed the triage vital signs and the nursing notes.   HISTORY  Chief Complaint Psychiatric Evaluation    HPI Tamara Shaw is a 70 y.o. female who recently was admitted to [facility] for altered mental status/delirium and had an extensive workup while in the hospital with both psychiatric and neurology consults, MRI, EEG, etc.  She was then discharged to [facility] health care due to her inability to care for herself.  She presents today for evaluationoh aggressive and violent behavior and reportedly attempted to hurt herself with a stapler.  The staff of [facility] reports that she was yelling and throwing objects at other residents as well as at the staff and then attempted to injure herself and was yelling that she was going to kill herself.  She is currently calm and knows that she is in the hospital but does not specifically remember what happened previously.  She does state that "I feel crazy" and states that this is been going on for a long time, gradually getting worse.  She states that sometimes she sees things that she knows are not there but she sees them anyway and also occasionally hears voices whispering to her.  She states that has been going on since before her hospitalization but has been getting worse.  She currently denies suicidal ideation and homicidal ideation and states that she does not want to hurt anyone or herself, she just does not want to feel crazy.  She feels that her symptoms are severe and that her medications do not seem to be helping.  Review of her prior hospital records indicate that there was some concern about hyperthyroidism and she began treatment with methimazole, but there are no other records available regarding her hyperthyroidism or  on-going treatment.  She denies fever/chills, chest pain, shortness of breath, nausea, vomiting, abdominal pain, dysuria.   Past Medical History:  Diagnosis Date  . Dementia   . Depression   . Diabetes mellitus without complication (HCC)   . Hypertension   . Thyroid disease    hypothyroid    Patient Active Problem List   Diagnosis Date Noted  . Psychosis 09/14/2015  . Elevated WBC count   . Generalized abdominal discomfort   . Delirium 09/05/2015  . Diabetes mellitus without complication (HCC) 09/05/2015  . Essential hypertension 09/05/2015  . Hypothyroidism 09/05/2015  . Leukocytosis 09/05/2015    History reviewed. No pertinent surgical history.  Prior to Admission medications   Medication Sig Start Date End Date Taking? Authorizing Provider  albuterol (PROVENTIL HFA;VENTOLIN HFA) 108 (90 Base) MCG/ACT inhaler Inhale 1-2 puffs into the lungs every 4 (four) hours as needed for wheezing or shortness of breath.   Yes Historical Provider, MD  aspirin EC 81 MG tablet Take 81 mg by mouth daily.   Yes Historical Provider, MD  carvedilol (COREG) 6.25 MG tablet Take 6.25 mg by mouth 2 (two) times daily with a meal.   Yes Historical Provider, MD  FLUoxetine (PROZAC) 20 MG capsule Take 20 mg by mouth daily.   Yes Historical Provider, MD  lisinopril (PRINIVIL,ZESTRIL) 5 MG tablet Take 5 mg by mouth daily.   Yes Historical Provider, MD  metFORMIN (GLUCOPHAGE) 1000 MG tablet Take 500 mg by mouth 2 (two) times daily with a meal.   Yes Historical Provider, MD  methimazole (TAPAZOLE)  5 MG tablet Take 3 tablets (15 mg total) by mouth daily. 09/12/15  Yes Haydee Salter, MD  pravastatin (PRAVACHOL) 80 MG tablet Take 80 mg by mouth daily.   Yes Historical Provider, MD  QUEtiapine (SEROQUEL) 25 MG tablet Take 1 tablet (25 mg total) by mouth 3 (three) times daily. 09/12/15  Yes Haydee Salter, MD  vitamin B-12 (CYANOCOBALAMIN) 1000 MCG tablet Take 1,000 mcg by mouth daily.   Yes Historical  Provider, MD    Allergies Review of patient's allergies indicates no known allergies.  Family History  Problem Relation Age of Onset  . Hypertension Mother   . Hypertension Father     Social History Social History  Substance Use Topics  . Smoking status: Never Smoker  . Smokeless tobacco: Not on file  . Alcohol use No    Review of Systems Constitutional: No fever/chills Eyes: No visual changes. ENT: No sore throat. Cardiovascular: Denies chest pain. Respiratory: Denies shortness of breath. Gastrointestinal: No abdominal pain.  No nausea, no vomiting.  No diarrhea.  No constipation. Genitourinary: Negative for dysuria. Musculoskeletal: Negative for back pain. Skin: Negative for rash. Neurological: Negative for headaches, focal weakness or numbness. Psych:  The patient reports visual and auditory hallucinations that wax and wane from time to time and states that she feels crazy but denies suicidal ideation and homicidal ideation.  10-point ROS otherwise negative.  ____________________________________________   PHYSICAL EXAM:  VITAL SIGNS: ED Triage Vitals [09/14/15 1359]  Enc Vitals Group     BP (!) 91/55     Pulse Rate 72     Resp 18     Temp 97.7 F (36.5 C)     Temp src      SpO2 97 %     Weight 163 lb (73.9 kg)     Height      Head Circumference      Peak Flow      Pain Score 0     Pain Loc      Pain Edu?      Excl. in GC?     Constitutional: Alert and oriented. Well appearing and in no acute distress. Eyes: Conjunctivae are normal. PERRL. EOMI. Head: Atraumatic. Nose: No congestion/rhinnorhea. Mouth/Throat: Mucous membranes are moist.  Oropharynx non-erythematous. Neck: No stridor.  No meningeal signs.   Cardiovascular: Normal rate, regular rhythm. Good peripheral circulation. Grossly normal heart sounds.   Respiratory: Normal respiratory effort.  No retractions. Lungs CTAB. Gastrointestinal: Soft and nontender. No distention.  Musculoskeletal:  No lower extremity tenderness nor edema. No gross deformities of extremities. Neurologic:  Normal speech and language. No gross focal neurologic deficits are appreciated.  Skin:  Skin is warm, dry.  She had a small amount of bleeding on the dorsal aspect of her right hand presumably was from the stapler injury but there is no open laceration and no wound to treat or address. Psychiatric: Mood and affect are Normal and calm at this time although she endorses feeling crazy and occasionally hearing voices and seeing things that are not there.  ____________________________________________   LABS (all labs ordered are listed, but only abnormal results are displayed)  Labs Reviewed  COMPREHENSIVE METABOLIC PANEL - Abnormal; Notable for the following:       Result Value   Glucose, Bld 136 (*)    BUN 30 (*)    Creatinine, Ser 1.24 (*)    GFR calc non Af Amer 42 (*)    GFR calc Af Amer 49 (*)  All other components within normal limits  ACETAMINOPHEN LEVEL - Abnormal; Notable for the following:    Acetaminophen (Tylenol), Serum <10 (*)    All other components within normal limits  T4, FREE - Abnormal; Notable for the following:    Free T4 0.46 (*)    All other components within normal limits  GLUCOSE, CAPILLARY - Abnormal; Notable for the following:    Glucose-Capillary 268 (*)    All other components within normal limits  URINE CULTURE  ETHANOL  CBC  SALICYLATE LEVEL  TSH  URINE DRUG SCREEN, QUALITATIVE ([facility] ONLY)  URINALYSIS COMPLETEWITH MICROSCOPIC ([facility] ONLY)  T3, FREE   ____________________________________________  EKG  None ____________________________________________  RADIOLOGY   No results found.  ____________________________________________   PROCEDURES  Procedure(s) performed:   Procedures   ____________________________________________   INITIAL IMPRESSION / ASSESSMENT AND PLAN / ED COURSE  Pertinent labs & imaging results that were available during  my care of the patient were reviewed by me and considered in my medical decision making (see chart for details).  I am ordering all the standard psych labs as well as urinalysis and thyroid function tests given her recent treatment for hyperthyroidism (not hypothyroidism as documented in the medical history).  She is currently calm.  She is slightly hypertensive but I suspect this is her baseline.  She is in no acute distress.  She was placed under involuntary commitment by the facility solo pulled that at this time and order psych consult.  Clinical Course    ____________________________________________  FINAL CLINICAL IMPRESSION(S) / ED DIAGNOSES  Final diagnoses:  Acute psychosis     MEDICATIONS GIVEN DURING THIS VISIT:  Medications  aspirin EC tablet 81 mg (81 mg Oral Given 09/14/15 2003)  carvedilol (COREG) tablet 6.25 mg (not administered)  FLUoxetine (PROZAC) capsule 20 mg (20 mg Oral Given 09/14/15 2003)  lisinopril (PRINIVIL,ZESTRIL) tablet 5 mg (5 mg Oral Given 09/14/15 2002)  metFORMIN (GLUCOPHAGE) tablet 500 mg (not administered)  methimazole (TAPAZOLE) tablet 15 mg (15 mg Oral Given 09/14/15 2016)  pravastatin (PRAVACHOL) tablet 80 mg (not administered)  vitamin B-12 (CYANOCOBALAMIN) tablet 1,000 mcg (1,000 mcg Oral Given 09/14/15 2016)  QUEtiapine (SEROQUEL) tablet 25 mg (25 mg Oral Given 09/14/15 2245)  insulin aspart (novoLOG) injection 0-15 Units (not administered)  insulin aspart (novoLOG) injection 0-5 Units (3 Units Subcutaneous Given 09/14/15 2254)  albuterol (PROVENTIL HFA;VENTOLIN HFA) 108 (90 Base) MCG/ACT inhaler 2 puff (not administered)     NEW OUTPATIENT MEDICATIONS STARTED DURING THIS VISIT:  New Prescriptions   No medications on file      Note:  This document was prepared using Dragon voice recognition software and may include unintentional dictation errors.    Loleta Rose, MD 09/14/15 2300

## 2015-09-15 ENCOUNTER — Emergency Department: Payer: Medicare Other

## 2015-09-15 DIAGNOSIS — R296 Repeated falls: Secondary | ICD-10-CM | POA: Diagnosis not present

## 2015-09-15 DIAGNOSIS — Z79899 Other long term (current) drug therapy: Secondary | ICD-10-CM | POA: Diagnosis not present

## 2015-09-15 DIAGNOSIS — I2510 Atherosclerotic heart disease of native coronary artery without angina pectoris: Secondary | ICD-10-CM | POA: Diagnosis not present

## 2015-09-15 DIAGNOSIS — E1122 Type 2 diabetes mellitus with diabetic chronic kidney disease: Secondary | ICD-10-CM | POA: Diagnosis not present

## 2015-09-15 DIAGNOSIS — G9341 Metabolic encephalopathy: Secondary | ICD-10-CM | POA: Diagnosis not present

## 2015-09-15 DIAGNOSIS — I959 Hypotension, unspecified: Secondary | ICD-10-CM | POA: Diagnosis not present

## 2015-09-15 DIAGNOSIS — R400 Somnolence: Secondary | ICD-10-CM | POA: Diagnosis present

## 2015-09-15 DIAGNOSIS — Z7984 Long term (current) use of oral hypoglycemic drugs: Secondary | ICD-10-CM | POA: Diagnosis not present

## 2015-09-15 DIAGNOSIS — J45909 Unspecified asthma, uncomplicated: Secondary | ICD-10-CM | POA: Diagnosis present

## 2015-09-15 DIAGNOSIS — F29 Unspecified psychosis not due to a substance or known physiological condition: Secondary | ICD-10-CM | POA: Diagnosis not present

## 2015-09-15 DIAGNOSIS — I4581 Long QT syndrome: Secondary | ICD-10-CM | POA: Diagnosis present

## 2015-09-15 DIAGNOSIS — Z8659 Personal history of other mental and behavioral disorders: Secondary | ICD-10-CM | POA: Diagnosis not present

## 2015-09-15 DIAGNOSIS — H9190 Unspecified hearing loss, unspecified ear: Secondary | ICD-10-CM | POA: Diagnosis present

## 2015-09-15 DIAGNOSIS — R32 Unspecified urinary incontinence: Secondary | ICD-10-CM | POA: Diagnosis not present

## 2015-09-15 DIAGNOSIS — J9611 Chronic respiratory failure with hypoxia: Secondary | ICD-10-CM | POA: Diagnosis not present

## 2015-09-15 DIAGNOSIS — N179 Acute kidney failure, unspecified: Secondary | ICD-10-CM | POA: Diagnosis present

## 2015-09-15 DIAGNOSIS — E039 Hypothyroidism, unspecified: Secondary | ICD-10-CM | POA: Diagnosis not present

## 2015-09-15 DIAGNOSIS — Z7982 Long term (current) use of aspirin: Secondary | ICD-10-CM | POA: Diagnosis not present

## 2015-09-15 DIAGNOSIS — E119 Type 2 diabetes mellitus without complications: Secondary | ICD-10-CM | POA: Diagnosis not present

## 2015-09-15 DIAGNOSIS — Z794 Long term (current) use of insulin: Secondary | ICD-10-CM | POA: Diagnosis not present

## 2015-09-15 DIAGNOSIS — E0590 Thyrotoxicosis, unspecified without thyrotoxic crisis or storm: Secondary | ICD-10-CM | POA: Diagnosis not present

## 2015-09-15 DIAGNOSIS — I272 Other secondary pulmonary hypertension: Secondary | ICD-10-CM | POA: Diagnosis not present

## 2015-09-15 DIAGNOSIS — E785 Hyperlipidemia, unspecified: Secondary | ICD-10-CM | POA: Diagnosis not present

## 2015-09-15 DIAGNOSIS — F329 Major depressive disorder, single episode, unspecified: Secondary | ICD-10-CM | POA: Diagnosis present

## 2015-09-15 DIAGNOSIS — F23 Brief psychotic disorder: Secondary | ICD-10-CM | POA: Diagnosis not present

## 2015-09-15 DIAGNOSIS — J9612 Chronic respiratory failure with hypercapnia: Secondary | ICD-10-CM | POA: Diagnosis not present

## 2015-09-15 DIAGNOSIS — N183 Chronic kidney disease, stage 3 (moderate): Secondary | ICD-10-CM | POA: Diagnosis not present

## 2015-09-15 DIAGNOSIS — E1151 Type 2 diabetes mellitus with diabetic peripheral angiopathy without gangrene: Secondary | ICD-10-CM | POA: Diagnosis present

## 2015-09-15 DIAGNOSIS — I10 Essential (primary) hypertension: Secondary | ICD-10-CM | POA: Diagnosis not present

## 2015-09-15 DIAGNOSIS — Z885 Allergy status to narcotic agent status: Secondary | ICD-10-CM | POA: Diagnosis not present

## 2015-09-15 DIAGNOSIS — F0391 Unspecified dementia with behavioral disturbance: Secondary | ICD-10-CM | POA: Diagnosis not present

## 2015-09-15 DIAGNOSIS — J449 Chronic obstructive pulmonary disease, unspecified: Secondary | ICD-10-CM | POA: Diagnosis not present

## 2015-09-15 DIAGNOSIS — Z021 Encounter for pre-employment examination: Secondary | ICD-10-CM | POA: Diagnosis not present

## 2015-09-15 DIAGNOSIS — I739 Peripheral vascular disease, unspecified: Secondary | ICD-10-CM | POA: Diagnosis not present

## 2015-09-15 DIAGNOSIS — K056 Periodontal disease, unspecified: Secondary | ICD-10-CM | POA: Diagnosis present

## 2015-09-15 DIAGNOSIS — F0281 Dementia in other diseases classified elsewhere with behavioral disturbance: Secondary | ICD-10-CM | POA: Diagnosis not present

## 2015-09-15 LAB — GLUCOSE, CAPILLARY
GLUCOSE-CAPILLARY: 137 mg/dL — AB (ref 65–99)
GLUCOSE-CAPILLARY: 193 mg/dL — AB (ref 65–99)
Glucose-Capillary: 162 mg/dL — ABNORMAL HIGH (ref 65–99)

## 2015-09-15 LAB — URINE DRUG SCREEN, QUALITATIVE (ARMC ONLY)
Amphetamines, Ur Screen: NOT DETECTED
BARBITURATES, UR SCREEN: NOT DETECTED
Benzodiazepine, Ur Scrn: NOT DETECTED
CANNABINOID 50 NG, UR ~~LOC~~: NOT DETECTED
Cocaine Metabolite,Ur ~~LOC~~: NOT DETECTED
MDMA (ECSTASY) UR SCREEN: NOT DETECTED
Methadone Scn, Ur: NOT DETECTED
Opiate, Ur Screen: NOT DETECTED
PHENCYCLIDINE (PCP) UR S: NOT DETECTED
TRICYCLIC, UR SCREEN: NOT DETECTED

## 2015-09-15 LAB — URINALYSIS COMPLETE WITH MICROSCOPIC (ARMC ONLY)
Bacteria, UA: NONE SEEN
Bilirubin Urine: NEGATIVE
GLUCOSE, UA: NEGATIVE mg/dL
Ketones, ur: NEGATIVE mg/dL
Leukocytes, UA: NEGATIVE
NITRITE: NEGATIVE
PH: 5 (ref 5.0–8.0)
Protein, ur: NEGATIVE mg/dL
RBC / HPF: NONE SEEN RBC/hpf (ref 0–5)
SPECIFIC GRAVITY, URINE: 1.005 (ref 1.005–1.030)

## 2015-09-15 LAB — T3, FREE: T3 FREE: 1.8 pg/mL — AB (ref 2.0–4.4)

## 2015-09-15 NOTE — ED Notes (Signed)
Called [facility] to let them know pt was on the way.

## 2015-09-15 NOTE — Progress Notes (Signed)
Referral information for Geriatric Placement have been faxed to;     [facility] [phone]),    [facility] [phone]),     [facility] [phone] or [phone]),    [facility] [phone]),    [facility] [phone]),    [facility] [phone])

## 2015-09-15 NOTE — BH Assessment (Signed)
Patient has been accepted to [facility].  Patient assigned to room Geriatric Unit  Accepting physician is Dr. Perley Jain.  Call report to [phone].  Representative was [employer].  ER Staff is aware of it Glendon Axe, ER Sect.; & Bill Patient's Nurse)      Health Care ([place] [phone]) Patient's Family/Support System (Donald/[phone]) have been updated as well.

## 2015-09-15 NOTE — ED Notes (Addendum)
I talked to Crissie Figures (nease) pts son per pts verbal permission. Told Mr. Leilani Able that pt had been accepted at [facility]. Sons # [phone]

## 2015-09-15 NOTE — ED Notes (Signed)
Attempted to call pt son donald to notify him pt was being transported. Phone ring and ring.

## 2015-09-15 NOTE — ED Notes (Signed)
Pt. Urinated in bed and did not tell anyone.  Pt. Advised to tell someone if she needed to use bathroom.  Undergarment put on pt.

## 2015-09-15 NOTE — BH Assessment (Signed)
Per the request of [facility] [phone]), EKG and Chest X-Ray were faxed to them, for consideration for inpatient treatment with their facility.

## 2015-09-15 NOTE — Consult Note (Signed)
[facility] Face-to-Face Psychiatry Consult   Reason for Consult:  Consult for this 70 year old woman sent from her living facility with reports that she had been aggressive. Referring Physician:  Karma Greaser Patient Identification: Tamara Shaw MRN:  [id number] Principal Diagnosis: Psychosis Diagnosis:   Patient Active Problem List   Diagnosis Date Noted  . Psychosis [F29] 09/14/2015  . Elevated WBC count [D72.829]   . Generalized abdominal discomfort [R10.84]   . Delirium [R41.0] 09/05/2015  . Diabetes mellitus without complication ([place]) [Z16.9] 09/05/2015  . Essential hypertension [I10] 09/05/2015  . Hypothyroidism [E03.9] 09/05/2015  . Leukocytosis [D72.829] 09/05/2015    Total Time spent with patient: 20 minutes  Subjective:   Tamara Shaw is a 70 y.o. female patient admitted with "I am dead".   This is a follow-up note on Friday the 28th for this 70 year old woman. Yesterday she was not communicating but today she is much more vocal. She knows where she is. She can tell me the story of how she got here. She continues to talk about auditory hallucinations and she continues to be disorganized in her thinking in a psychotic manner. She also told me some things that sound like they are a history of mental health problems from earlier in her life. When I ask her if she had ever been to psychiatric hospitals in the past she told me that her ex-husband would never allow her to go and then she started talking about what the voices used to say. All of this just increases my believe that this is a psychotic disorder probably schizophrenia or schizoaffective or psychotic depression and not a delirium. Patient has generally been calm. She is toileting normally. Eating normally. Not aggressive.  HPI:  Patient interviewed. Chart reviewed including her recent admission at [facility] and vitals reviewed. I attempted to call her son on the phone but there was no answer. 70 year old woman  sent here from her assisted living facility with reports that she had been aggressive. Patient was not able to offer much in the way of useful history. She was clearly awake and alert and attentive during our interview but her speech was very limited. She told me at the beginning of the interview that she had recently died and that when she died "I was in excruciating pain". She told me that now that she has died she is no longer having any pain. Patient answers most other questions by either shrugging her shoulders or making a variety of odd gestures with her hands that seem reminiscent of sign language. I don't think however that she is deaf as she is able to understand what I'm saying quite clearly and when she does speak it is in a quiet and articulate tone. Patient told me the only other thing she wanted to let me know was the name of her son "with whom I am most pleased". She did correctly tell me the name of her son as it is listed in the chart. Patient apparently was just at [facility] recently and was discharged only about 2 days ago. She was there for what was called a delirium and received an extensive workup which didn't reveal any clear pathology except for hyper thyroid. They appear to have reached the conclusion that she was delirious from being hyperthyroid..  Social history: My understanding from reading her notes at [facility] is that she had previously been living in [place] and had been at least semi-independent. She was apparently hospitalized there for  some period of time and then [place] her [place] and now hospitalized here. The son appears to be her closest relative.  Medical history: Patient wasn't able to give me much information. Patient clearly has hypertension and diabetes and recently was diagnosed as hyperthyroid. I am a little bit puzzled by that. Reviewing all of her thyroid tests over the recent hospitalization none of  them seemed all that dramatically high or low. It doesn't seem really that clear to me that she was clinically hyperthyroid. Perhaps this needs to be looked into more clearly. Her current lab test show a low thyroid level.  Substance abuse history: No evidence or past history reported of any substance abuse  Past Psychiatric History: The notes from [facility] reports that her family said that she had started to act strangely and talk about delusions of being visited by [place] and [facility] for several months. Prior to that we don't know any other psychiatric history. I had hoped to talk to her son but have not been able to reach him. Evidently psychiatric consult at [facility] thought that all of this was delirium.  Risk to Self: Suicidal Ideation: Yes-Currently Present Suicidal Intent: Yes-Currently Present Is patient at risk for suicide?: No Suicidal Plan?: (P) Yes-Currently Present Risk to Others:   Prior Inpatient Therapy:   Prior Outpatient Therapy:    Past Medical History:  Past Medical History:  Diagnosis Date  . Dementia   . Depression   . Diabetes mellitus without complication ([place])   . Hypertension   . Thyroid disease    hypothyroid   History reviewed. No pertinent surgical history. Family History:  Family History  Problem Relation Age of Onset  . Hypertension Mother   . Hypertension Father    Family Psychiatric  History: Unknown Social History:  History  Alcohol Use No     History  Drug Use No    Social History   Social History  . Marital status: Married    Spouse name: N/A  . Number of children: N/A  . Years of education: N/A   Social History Main Topics  . Smoking status: Never Smoker  . Smokeless tobacco: None  . Alcohol use No  . Drug use: No  . Sexual activity: Not Asked   Other Topics Concern  . None   Social History Narrative  . None   Additional Social History:    Allergies:  No Known Allergies  Labs:  Results for orders placed or  performed during the hospital encounter of 09/14/15 (from the past 48 hour(s))  Comprehensive metabolic panel     Status: Abnormal   Collection Time: 09/14/15  2:04 PM  Result Value Ref Range   Sodium 138 135 - 145 mmol/L   Potassium 4.6 3.5 - 5.1 mmol/L   Chloride 104 101 - 111 mmol/L   CO2 25 22 - 32 mmol/L   Glucose, Bld 136 (H) 65 - 99 mg/dL   BUN 30 (H) 6 - 20 mg/dL   Creatinine, Ser 1.24 (H) 0.44 - 1.00 mg/dL   Calcium 9.7 8.9 - 10.3 mg/dL   Total Protein 6.6 6.5 - 8.1 g/dL   Albumin 3.7 3.5 - 5.0 g/dL   AST 33 15 - 41 U/L   ALT 23 14 - 54 U/L   Alkaline Phosphatase 77 38 - 126 U/L   Total Bilirubin 0.4 0.3 - 1.2 mg/dL   GFR calc non Af Amer 42 (L) >  60 mL/min   GFR calc Af Amer 49 (L) >60 mL/min    Comment: (NOTE) The eGFR has been calculated using the CKD EPI equation. This calculation has not been validated in all clinical situations. eGFR's persistently <60 mL/min signify possible Chronic Kidney Disease.    Anion gap 9 5 - 15  Ethanol     Status: None   Collection Time: 09/14/15  2:04 PM  Result Value Ref Range   Alcohol, Ethyl (B) <5 <5 mg/dL    Comment:        LOWEST DETECTABLE LIMIT FOR SERUM ALCOHOL IS 5 mg/dL FOR MEDICAL PURPOSES ONLY   cbc     Status: None   Collection Time: 09/14/15  2:04 PM  Result Value Ref Range   WBC 10.6 3.6 - 11.0 K/uL   RBC 4.00 3.80 - 5.20 MIL/uL   Hemoglobin 13.2 12.0 - 16.0 g/dL   HCT 37.5 35.0 - 47.0 %   MCV 93.7 80.0 - 100.0 fL   MCH 32.9 26.0 - 34.0 pg   MCHC 35.1 32.0 - 36.0 g/dL   RDW 13.5 11.5 - 14.5 %   Platelets 277 150 - 440 K/uL  Urine Drug Screen, Qualitative     Status: None   Collection Time: 09/14/15  2:04 PM  Result Value Ref Range   Tricyclic, Ur Screen NONE DETECTED NONE DETECTED   Amphetamines, Ur Screen NONE DETECTED NONE DETECTED   MDMA (Ecstasy)Ur Screen NONE DETECTED NONE DETECTED   Cocaine Metabolite,Ur [place] NONE DETECTED NONE DETECTED   Opiate, Ur Screen NONE DETECTED NONE DETECTED   Phencyclidine  (PCP) Ur S NONE DETECTED NONE DETECTED   Cannabinoid 50 Ng, Ur [place] NONE DETECTED NONE DETECTED   Barbiturates, Ur Screen NONE DETECTED NONE DETECTED   Benzodiazepine, Ur Scrn NONE DETECTED NONE DETECTED   Methadone Scn, Ur NONE DETECTED NONE DETECTED    Comment: (NOTE) 195  Tricyclics, urine               Cutoff 1000 ng/mL 200  Amphetamines, urine             Cutoff 1000 ng/mL 300  MDMA (Ecstasy), urine           Cutoff 500 ng/mL 400  Cocaine Metabolite, urine       Cutoff 300 ng/mL 500  Opiate, urine                   Cutoff 300 ng/mL 600  Phencyclidine (PCP), urine      Cutoff 25 ng/mL 700  Cannabinoid, urine              Cutoff 50 ng/mL 800  Barbiturates, urine             Cutoff 200 ng/mL 900  Benzodiazepine, urine           Cutoff 200 ng/mL 1000 Methadone, urine                Cutoff 300 ng/mL 1100 1200 The urine drug screen provides only a preliminary, unconfirmed 1300 analytical test result and should not be used for non-medical 1400 purposes. Clinical consideration and professional judgment should 1500 be applied to any positive drug screen result due to possible 1600 interfering substances. A more specific alternate chemical method 1700 must be used in order to obtain a confirmed analytical result.  1800 Gas chromato graphy / mass spectrometry (GC/MS) is the preferred 1900 confirmatory method.   Salicylate level     Status: None  Collection Time: 09/14/15  2:04 PM  Result Value Ref Range   Salicylate Lvl <6.7 2.8 - 30.0 mg/dL  Acetaminophen level     Status: Abnormal   Collection Time: 09/14/15  2:04 PM  Result Value Ref Range   Acetaminophen (Tylenol), Serum <10 (L) 10 - 30 ug/mL    Comment:        THERAPEUTIC CONCENTRATIONS VARY SIGNIFICANTLY. A RANGE OF 10-30 ug/mL MAY BE AN EFFECTIVE CONCENTRATION FOR MANY PATIENTS. HOWEVER, SOME ARE BEST TREATED AT CONCENTRATIONS OUTSIDE THIS RANGE. ACETAMINOPHEN CONCENTRATIONS >150 ug/mL AT 4 HOURS AFTER INGESTION AND >50  ug/mL AT 12 HOURS AFTER INGESTION ARE OFTEN ASSOCIATED WITH TOXIC REACTIONS.   Urinalysis complete, with microscopic ([facility] only)     Status: Abnormal   Collection Time: 09/14/15  2:04 PM  Result Value Ref Range   Color, Urine STRAW (A) YELLOW   APPearance CLEAR (A) CLEAR   Glucose, UA NEGATIVE NEGATIVE mg/dL   Bilirubin Urine NEGATIVE NEGATIVE   Ketones, ur NEGATIVE NEGATIVE mg/dL   Specific Gravity, Urine 1.005 1.005 - 1.030   Hgb urine dipstick 1+ (A) NEGATIVE   pH 5.0 5.0 - 8.0   Protein, ur NEGATIVE NEGATIVE mg/dL   Nitrite NEGATIVE NEGATIVE   Leukocytes, UA NEGATIVE NEGATIVE   RBC / HPF NONE SEEN 0 - 5 RBC/hpf   WBC, UA 0-5 0 - 5 WBC/hpf   Bacteria, UA NONE SEEN NONE SEEN   Squamous Epithelial / LPF 0-5 (A) NONE SEEN  TSH     Status: None   Collection Time: 09/14/15  2:04 PM  Result Value Ref Range   TSH 0.784 0.350 - 4.500 uIU/mL  T4, free     Status: Abnormal   Collection Time: 09/14/15  2:04 PM  Result Value Ref Range   Free T4 0.46 (L) 0.61 - 1.12 ng/dL    Comment: (NOTE) Biotin ingestion may interfere with free T4 tests. If the results are inconsistent with the TSH level, previous test results, or the clinical presentation, then consider biotin interference. If needed, order repeat testing after stopping biotin.   T3, free     Status: Abnormal   Collection Time: 09/14/15  2:04 PM  Result Value Ref Range   T3, Free 1.8 (L) 2.0 - 4.4 pg/mL    Comment: (NOTE) Performed At: [facility] [address] [place], [place] [id number] Lindon Romp MD PJ:[id number]   Glucose, capillary     Status: Abnormal   Collection Time: 09/14/15 10:45 PM  Result Value Ref Range   Glucose-Capillary 268 (H) 65 - 99 mg/dL  Glucose, capillary     Status: Abnormal   Collection Time: 09/15/15  8:09 AM  Result Value Ref Range   Glucose-Capillary 137 (H) 65 - 99 mg/dL   Comment 1 Notify RN   Glucose, capillary     Status: Abnormal   Collection Time: 09/15/15 12:16 PM   Result Value Ref Range   Glucose-Capillary 193 (H) 65 - 99 mg/dL   Comment 1 Notify RN     Current Facility-Administered Medications  Medication Dose Route Frequency Provider Last Rate Last Dose  . albuterol (PROVENTIL HFA;VENTOLIN HFA) 108 (90 Base) MCG/ACT inhaler 2 puff  2 puff Inhalation Q4H PRN Merilyn Baba, RPH      . aspirin EC tablet 81 mg  81 mg Oral Daily Gonzella Lex, MD   81 mg at 09/15/15 3419  . carvedilol (COREG) tablet 6.25 mg  6.25 mg Oral BID WC John T Clapacs,  MD   6.25 mg at 09/15/15 0939  . FLUoxetine (PROZAC) capsule 20 mg  20 mg Oral Daily Gonzella Lex, MD   20 mg at 09/15/15 3903  . insulin aspart (novoLOG) injection 0-15 Units  0-15 Units Subcutaneous TID WC Gonzella Lex, MD   3 Units at 09/15/15 1235  . insulin aspart (novoLOG) injection 0-5 Units  0-5 Units Subcutaneous QHS Gonzella Lex, MD   3 Units at 09/14/15 2254  . lisinopril (PRINIVIL,ZESTRIL) tablet 5 mg  5 mg Oral Daily Gonzella Lex, MD   5 mg at 09/15/15 0092  . metFORMIN (GLUCOPHAGE) tablet 500 mg  500 mg Oral BID WC Gonzella Lex, MD   500 mg at 09/15/15 0939  . methimazole (TAPAZOLE) tablet 15 mg  15 mg Oral Daily Gonzella Lex, MD   15 mg at 09/15/15 3300  . pravastatin (PRAVACHOL) tablet 80 mg  80 mg Oral q1800 Gonzella Lex, MD      . QUEtiapine (SEROQUEL) tablet 25 mg  25 mg Oral TID Gonzella Lex, MD   25 mg at 09/15/15 7622  . vitamin B-12 (CYANOCOBALAMIN) tablet 1,000 mcg  1,000 mcg Oral Daily Gonzella Lex, MD   1,000 mcg at 09/15/15 6333   Current Outpatient Prescriptions  Medication Sig Dispense Refill  . albuterol (PROVENTIL HFA;VENTOLIN HFA) 108 (90 Base) MCG/ACT inhaler Inhale 1-2 puffs into the lungs every 4 (four) hours as needed for wheezing or shortness of breath.    Marland Kitchen aspirin EC 81 MG tablet Take 81 mg by mouth daily.    . carvedilol (COREG) 6.25 MG tablet Take 6.25 mg by mouth 2 (two) times daily with a meal.    . FLUoxetine (PROZAC) 20 MG capsule Take 20 mg by  mouth daily.    Marland Kitchen lisinopril (PRINIVIL,ZESTRIL) 5 MG tablet Take 5 mg by mouth daily.    . metFORMIN (GLUCOPHAGE) 1000 MG tablet Take 500 mg by mouth 2 (two) times daily with a meal.    . methimazole (TAPAZOLE) 5 MG tablet Take 3 tablets (15 mg total) by mouth daily. 30 tablet 0  . pravastatin (PRAVACHOL) 80 MG tablet Take 80 mg by mouth daily.    . QUEtiapine (SEROQUEL) 25 MG tablet Take 1 tablet (25 mg total) by mouth 3 (three) times daily. 90 tablet 0  . vitamin B-12 (CYANOCOBALAMIN) 1000 MCG tablet Take 1,000 mcg by mouth daily.      Musculoskeletal: Strength & Muscle Tone: within normal limits Gait & Station: normal Patient leans: N/A  Psychiatric Specialty Exam: Physical Exam  Nursing note and vitals reviewed. Constitutional: She appears well-developed and well-nourished.  HENT:  Head: Normocephalic and atraumatic.  Eyes: Conjunctivae are normal. Pupils are equal, round, and reactive to light.  Neck: Normal range of motion.  Cardiovascular: Regular rhythm and normal heart sounds.   Respiratory: Effort normal. No respiratory distress.  GI: Soft.  Musculoskeletal: Normal range of motion.  Neurological: She is alert.  Skin: Skin is warm and dry.  Psychiatric: Her affect is blunt. Her affect is not inappropriate. Her speech is delayed. She is slowed. Thought content is delusional. Cognition and memory are impaired. She expresses inappropriate judgment. She is communicative.    Review of Systems  Constitutional: Negative.   HENT: Negative.   Eyes: Negative.   Respiratory: Negative.   Cardiovascular: Negative.   Gastrointestinal: Negative.   Musculoskeletal: Negative.   Skin: Negative.   Neurological: Negative.   Psychiatric/Behavioral: Positive for hallucinations and memory  loss. Negative for depression, substance abuse and suicidal ideas. The patient is nervous/anxious. The patient does not have insomnia.     Blood pressure 100/61, pulse 75, temperature 97.6 F (36.4 C),  temperature source Oral, resp. rate 18, weight 73.9 kg (163 lb), SpO2 96 %.Body mass index is 29.81 kg/m.  General Appearance: Disheveled  Eye Contact:  Fair  Speech:  Slow  Volume:  Decreased  Mood:  When I ask her this she was not able to give me an articulate answer but made some faces and some hand gestures.  Affect:  Constricted  Thought Process:  Disorganized  Orientation:  Negative  Thought Content:  Delusions and Hallucinations: Auditory  Suicidal Thoughts:  No  Homicidal Thoughts:  No  Memory:  Negative  Judgement:  Impaired  Insight:  Lacking  Psychomotor Activity:  Decreased  Concentration:  Concentration: Poor  Recall:  Poor  Fund of Knowledge:  Poor  Language:  Poor  Akathisia:  No  Handed:  Right  AIMS (if indicated):     Assets:  Social Support  ADL's:  Impaired  Cognition:  Impaired,  Mild  Sleep:        Treatment Plan Summary: Daily contact with patient to assess and evaluate symptoms and progress in treatment, Medication management and Plan Patient spoke to me again today. Chart reviewed. After further consideration and I still believe that this 70 year old woman has a psychotic disorder and not a "delirium". She had been referred out to geriatric psychiatry facilities and apparently has now been accepted at [facility] and arrangements are being made. No change to current medicine. Vitals are stable. Patient reassured that we will be getting her help and she is agreeable.  Disposition: Recommend psychiatric Inpatient admission when medically cleared. Supportive therapy provided about ongoing stressors.  Alethia Berthold, MD 09/15/2015 3:01 PM

## 2015-09-15 NOTE — ED Notes (Signed)
Pt. Helped to bathroom.  Pt. Is now asking to use bathroom.

## 2015-09-15 NOTE — BH Assessment (Signed)
Assessment Note  Tamara Shaw is an 70 y.o. female Who presents to the ER due to voicing SI while at her care facility. Patient stated she wanted to die and then stated she was going to kill staff and other residents. After she starting throwing things at the staff and other residents. Patient still voice SI but denies HI.   Diagnosis: Depression  Past Medical History:  Past Medical History:  Diagnosis Date  . Dementia   . Depression   . Diabetes mellitus without complication (HCC)   . Hypertension   . Thyroid disease    hypothyroid    History reviewed. No pertinent surgical history.  Family History:  Family History  Problem Relation Age of Onset  . Hypertension Mother   . Hypertension Father     Social History:  reports that she has never smoked. She does not have any smokeless tobacco history on file. She reports that she does not drink alcohol or use drugs.  Additional Social History:  Alcohol / Drug Use Pain Medications: See PTA Prescriptions: See PTA Over the Counter: See PTA History of alcohol / drug use?: No history of alcohol / drug abuse Longest period of sobriety (when/how long): Reports of no past or current use Negative Consequences of Use:  (Reports of none) Withdrawal Symptoms:  (Reports of none)  CIWA: CIWA-Ar BP: 100/61 Pulse Rate: 75 COWS:    Allergies: No Known Allergies  Home Medications:  (Not in a hospital admission)  OB/GYN Status:  No LMP recorded. Patient is postmenopausal.  General Assessment Data Location of Assessment: [facility] ED TTS Assessment: In system Is this a Tele or Face-to-Face Assessment?: Face-to-Face Is this an Initial Assessment or a Re-assessment for this encounter?: Initial Assessment Marital status: Divorced [place] name: Unknown Is patient pregnant?: No Pregnancy Status: No Living Arrangements: Other (Comment) [facility] [phone])) Can pt return to current living arrangement?: Yes Admission Status:  Involuntary Is patient capable of signing voluntary admission?: No Referral Source: Self/Family/Friend Insurance type: Medicare  Medical Screening Exam [facility] Walk-in ONLY) Medical Exam completed: Yes  Crisis Care Plan Living Arrangements: Other (Comment) [employer] [facility] [phone])) Legal Guardian: Other: (Reports of none) Name of Psychiatrist: Reports of none Name of Therapist: Reports of none  Education Status Is patient currently in school?: No Current Grade: n/a Highest grade of school patient has completed: Unknown, patient didn't say Name of school: n/a Contact person: n/a  Risk to self with the past 6 months Suicidal Ideation: Yes-Currently Present Has patient been a risk to self within the past 6 months prior to admission? : Yes Suicidal Intent: Yes-Currently Present Has patient had any suicidal intent within the past 6 months prior to admission? : Yes Is patient at risk for suicide?: No Suicidal Plan?: Yes-Currently Present Has patient had any suicidal plan within the past 6 months prior to admission? : No Specify Current Suicidal Plan: No speific plan Access to Means: No What has been your use of drugs/alcohol within the last 12 months?: Reports of none Previous Attempts/Gestures: Yes How many times?: 2 Other Self Harm Risks: Reports of none Triggers for Past Attempts: Unknown Intentional Self Injurious Behavior: None Family Suicide History: Unknown Recent stressful life event(s): Recent negative physical changes Depression: Yes Depression Symptoms: Feeling angry/irritable, Feeling worthless/self pity, Isolating, Fatigue, Tearfulness, Loss of interest in usual pleasures Substance abuse history and/or treatment for substance abuse?: No Suicide prevention information given to non-admitted patients: Not applicable  Risk to Others within the past 6 months Homicidal Ideation:  Yes-Currently Present Does patient have any lifetime risk of violence toward others  beyond the six months prior to admission? : No Thoughts of Harm to Others: No-Not Currently Present/Within Last 6 Months Current Homicidal Intent: No Current Homicidal Plan: No-Not Currently/Within Last 6 Months Access to Homicidal Means: No Identified Victim: Hydrographic surveyor and residents History of harm to others?: No Assessment of Violence: In distant past Violent Behavior Description: Threw items at Care Home staff Does patient have access to weapons?: No Criminal Charges Pending?: No Does patient have a court date: No Is patient on probation?: No  Psychosis Hallucinations: None noted Delusions: None noted  Mental Status Report Appearance/Hygiene: In hospital gown, In scrubs, Unremarkable Eye Contact: Fair Motor Activity: Unremarkable, Freedom of movement Speech: Logical/coherent, Unremarkable Level of Consciousness: Alert Mood: Anxious, Depressed Affect: Depressed, Sad Anxiety Level: Minimal Thought Processes: Coherent, Relevant Judgement: Partial Orientation: Person, Time, Place, Situation, Appropriate for developmental age Obsessive Compulsive Thoughts/Behaviors: Minimal  Cognitive Functioning Concentration: Normal Memory: Recent Intact, Remote Intact IQ: Average Insight: Fair Impulse Control: Poor Appetite: Fair Weight Loss: 0 Weight Gain: 0 Sleep: No Change Total Hours of Sleep: 8 Vegetative Symptoms: None  ADLScreening [facility] Assessment Services) Patient's cognitive ability adequate to safely complete daily activities?: No Patient able to express need for assistance with ADLs?: No Independently performs ADLs?: No  Prior Inpatient Therapy Prior Inpatient Therapy: No Prior Therapy Dates: Unknown, patient didn't answer Prior Therapy Facilty/Provider(s): Unknown, patient didn't answer Reason for Treatment: Unknown, patient didn't answer  Prior Outpatient Therapy Prior Outpatient Therapy: No Prior Therapy Dates: Unknown, patient didn't answer Prior  Therapy Facilty/Provider(s): Unknown, patient didn't answer Reason for Treatment: Unknown, patient didn't answer Does patient have an ACCT team?: No Does patient have Intensive In-House Services?  : No Does patient have [facility] services? : No Does patient have P4CC services?: No  ADL Screening (condition at time of admission) Patient's cognitive ability adequate to safely complete daily activities?: No Patient able to express need for assistance with ADLs?: No Independently performs ADLs?: No       Abuse/Neglect Assessment (Assessment to be complete while patient is alone) Physical Abuse: Denies Verbal Abuse: Denies Sexual Abuse: Denies Exploitation of patient/patient's resources: Denies Self-Neglect: Denies Values / Beliefs Cultural Requests During Hospitalization: None Spiritual Requests During Hospitalization: None Consults Spiritual Care Consult Needed: No Social Work Consult Needed: No Merchant navy officer (For Healthcare) Does patient have an advance directive?: No    Additional Information 1:1 In Past 12 Months?: No CIRT Risk: No Elopement Risk: No Does patient have medical clearance?: Yes  Child/Adolescent Assessment Running Away Risk: Denies (Patient is an adult)  Disposition:  Disposition Initial Assessment Completed for this Encounter: Yes Disposition of Patient: Other dispositions (ER MD ordered Psych Consult)  On Site Evaluation by:   Reviewed with Physician:    Lilyan Gilford MS, LCAS, LPC, NCC, CCSI Therapeutic Triage Specialist 09/15/2015 3:21 PM

## 2015-09-16 LAB — URINE CULTURE
Culture: <10000 — AB
Special Requests: NORMAL

## 2015-09-20 LAB — THYROID STIMULATING IMMUNOGLOBULIN: THYROID STIMULATING IMMUNOGLOB: 41 % (ref 0–139)

## 2015-09-29 DIAGNOSIS — I9589 Other hypotension: Secondary | ICD-10-CM | POA: Diagnosis not present

## 2015-09-29 DIAGNOSIS — I959 Hypotension, unspecified: Secondary | ICD-10-CM | POA: Diagnosis not present

## 2015-09-29 DIAGNOSIS — F0391 Unspecified dementia with behavioral disturbance: Secondary | ICD-10-CM | POA: Diagnosis not present

## 2015-09-29 DIAGNOSIS — R0989 Other specified symptoms and signs involving the circulatory and respiratory systems: Secondary | ICD-10-CM | POA: Diagnosis not present

## 2015-09-29 DIAGNOSIS — E038 Other specified hypothyroidism: Secondary | ICD-10-CM | POA: Diagnosis not present

## 2015-09-30 DIAGNOSIS — R0989 Other specified symptoms and signs involving the circulatory and respiratory systems: Secondary | ICD-10-CM | POA: Diagnosis not present

## 2015-09-30 DIAGNOSIS — I9589 Other hypotension: Secondary | ICD-10-CM | POA: Diagnosis not present

## 2015-09-30 DIAGNOSIS — E038 Other specified hypothyroidism: Secondary | ICD-10-CM | POA: Diagnosis not present

## 2015-10-01 DIAGNOSIS — E038 Other specified hypothyroidism: Secondary | ICD-10-CM | POA: Diagnosis not present

## 2015-10-01 DIAGNOSIS — R0989 Other specified symptoms and signs involving the circulatory and respiratory systems: Secondary | ICD-10-CM | POA: Diagnosis not present

## 2015-10-01 DIAGNOSIS — I9589 Other hypotension: Secondary | ICD-10-CM | POA: Diagnosis not present

## 2015-10-02 DIAGNOSIS — E038 Other specified hypothyroidism: Secondary | ICD-10-CM | POA: Diagnosis not present

## 2015-10-02 DIAGNOSIS — I9589 Other hypotension: Secondary | ICD-10-CM | POA: Diagnosis not present

## 2015-10-02 DIAGNOSIS — F319 Bipolar disorder, unspecified: Secondary | ICD-10-CM | POA: Diagnosis not present

## 2015-10-02 DIAGNOSIS — F0391 Unspecified dementia with behavioral disturbance: Secondary | ICD-10-CM | POA: Diagnosis not present

## 2015-10-02 DIAGNOSIS — R0989 Other specified symptoms and signs involving the circulatory and respiratory systems: Secondary | ICD-10-CM | POA: Diagnosis not present

## 2015-10-02 DIAGNOSIS — I959 Hypotension, unspecified: Secondary | ICD-10-CM | POA: Diagnosis not present

## 2015-10-03 DIAGNOSIS — I9589 Other hypotension: Secondary | ICD-10-CM | POA: Diagnosis not present

## 2015-10-03 DIAGNOSIS — J9611 Chronic respiratory failure with hypoxia: Secondary | ICD-10-CM | POA: Diagnosis not present

## 2015-10-03 DIAGNOSIS — E038 Other specified hypothyroidism: Secondary | ICD-10-CM | POA: Diagnosis not present

## 2015-10-03 DIAGNOSIS — F319 Bipolar disorder, unspecified: Secondary | ICD-10-CM | POA: Diagnosis not present

## 2015-10-03 DIAGNOSIS — F0281 Dementia in other diseases classified elsewhere with behavioral disturbance: Secondary | ICD-10-CM | POA: Diagnosis not present

## 2015-10-03 DIAGNOSIS — J9612 Chronic respiratory failure with hypercapnia: Secondary | ICD-10-CM | POA: Diagnosis not present

## 2015-10-03 DIAGNOSIS — F0391 Unspecified dementia with behavioral disturbance: Secondary | ICD-10-CM | POA: Diagnosis not present

## 2015-10-04 DIAGNOSIS — J9612 Chronic respiratory failure with hypercapnia: Secondary | ICD-10-CM | POA: Diagnosis not present

## 2015-10-04 DIAGNOSIS — I959 Hypotension, unspecified: Secondary | ICD-10-CM | POA: Diagnosis not present

## 2015-10-04 DIAGNOSIS — F0281 Dementia in other diseases classified elsewhere with behavioral disturbance: Secondary | ICD-10-CM | POA: Diagnosis not present

## 2015-10-04 DIAGNOSIS — J9611 Chronic respiratory failure with hypoxia: Secondary | ICD-10-CM | POA: Diagnosis not present

## 2015-10-04 DIAGNOSIS — F319 Bipolar disorder, unspecified: Secondary | ICD-10-CM | POA: Diagnosis not present

## 2015-10-04 DIAGNOSIS — E038 Other specified hypothyroidism: Secondary | ICD-10-CM | POA: Diagnosis not present

## 2015-10-04 DIAGNOSIS — I9589 Other hypotension: Secondary | ICD-10-CM | POA: Diagnosis not present

## 2015-10-05 DIAGNOSIS — I9589 Other hypotension: Secondary | ICD-10-CM | POA: Diagnosis not present

## 2015-10-05 DIAGNOSIS — I959 Hypotension, unspecified: Secondary | ICD-10-CM | POA: Diagnosis not present

## 2015-10-05 DIAGNOSIS — J9611 Chronic respiratory failure with hypoxia: Secondary | ICD-10-CM | POA: Diagnosis not present

## 2015-10-05 DIAGNOSIS — F319 Bipolar disorder, unspecified: Secondary | ICD-10-CM | POA: Diagnosis not present

## 2015-10-05 DIAGNOSIS — J9612 Chronic respiratory failure with hypercapnia: Secondary | ICD-10-CM | POA: Diagnosis not present

## 2015-10-05 DIAGNOSIS — F0281 Dementia in other diseases classified elsewhere with behavioral disturbance: Secondary | ICD-10-CM | POA: Diagnosis not present

## 2015-10-05 DIAGNOSIS — E038 Other specified hypothyroidism: Secondary | ICD-10-CM | POA: Diagnosis not present

## 2015-10-06 DIAGNOSIS — I959 Hypotension, unspecified: Secondary | ICD-10-CM | POA: Diagnosis not present

## 2015-10-06 DIAGNOSIS — J9612 Chronic respiratory failure with hypercapnia: Secondary | ICD-10-CM | POA: Diagnosis not present

## 2015-10-06 DIAGNOSIS — I9589 Other hypotension: Secondary | ICD-10-CM | POA: Diagnosis not present

## 2015-10-06 DIAGNOSIS — F0281 Dementia in other diseases classified elsewhere with behavioral disturbance: Secondary | ICD-10-CM | POA: Diagnosis not present

## 2015-10-06 DIAGNOSIS — F319 Bipolar disorder, unspecified: Secondary | ICD-10-CM | POA: Diagnosis not present

## 2015-10-06 DIAGNOSIS — E038 Other specified hypothyroidism: Secondary | ICD-10-CM | POA: Diagnosis not present

## 2015-10-06 DIAGNOSIS — J9611 Chronic respiratory failure with hypoxia: Secondary | ICD-10-CM | POA: Diagnosis not present

## 2015-10-07 DIAGNOSIS — Z794 Long term (current) use of insulin: Secondary | ICD-10-CM | POA: Diagnosis not present

## 2015-10-07 DIAGNOSIS — F062 Psychotic disorder with delusions due to known physiological condition: Secondary | ICD-10-CM | POA: Diagnosis not present

## 2015-10-07 DIAGNOSIS — R296 Repeated falls: Secondary | ICD-10-CM | POA: Diagnosis not present

## 2015-10-07 DIAGNOSIS — E039 Hypothyroidism, unspecified: Secondary | ICD-10-CM | POA: Diagnosis present

## 2015-10-07 DIAGNOSIS — J9612 Chronic respiratory failure with hypercapnia: Secondary | ICD-10-CM | POA: Diagnosis not present

## 2015-10-07 DIAGNOSIS — I10 Essential (primary) hypertension: Secondary | ICD-10-CM | POA: Diagnosis present

## 2015-10-07 DIAGNOSIS — Z7982 Long term (current) use of aspirin: Secondary | ICD-10-CM | POA: Diagnosis not present

## 2015-10-07 DIAGNOSIS — Z90710 Acquired absence of both cervix and uterus: Secondary | ICD-10-CM | POA: Diagnosis not present

## 2015-10-07 DIAGNOSIS — Z9181 History of falling: Secondary | ICD-10-CM | POA: Diagnosis not present

## 2015-10-07 DIAGNOSIS — Z885 Allergy status to narcotic agent status: Secondary | ICD-10-CM | POA: Diagnosis not present

## 2015-10-07 DIAGNOSIS — F29 Unspecified psychosis not due to a substance or known physiological condition: Secondary | ICD-10-CM | POA: Diagnosis not present

## 2015-10-07 DIAGNOSIS — I739 Peripheral vascular disease, unspecified: Secondary | ICD-10-CM | POA: Diagnosis not present

## 2015-10-07 DIAGNOSIS — J449 Chronic obstructive pulmonary disease, unspecified: Secondary | ICD-10-CM | POA: Diagnosis present

## 2015-10-07 DIAGNOSIS — I4581 Long QT syndrome: Secondary | ICD-10-CM | POA: Diagnosis present

## 2015-10-07 DIAGNOSIS — E1151 Type 2 diabetes mellitus with diabetic peripheral angiopathy without gangrene: Secondary | ICD-10-CM | POA: Diagnosis present

## 2015-10-07 DIAGNOSIS — E1165 Type 2 diabetes mellitus with hyperglycemia: Secondary | ICD-10-CM | POA: Diagnosis present

## 2015-10-07 DIAGNOSIS — J9611 Chronic respiratory failure with hypoxia: Secondary | ICD-10-CM | POA: Diagnosis not present

## 2015-10-07 DIAGNOSIS — I9589 Other hypotension: Secondary | ICD-10-CM | POA: Diagnosis not present

## 2015-10-07 DIAGNOSIS — E119 Type 2 diabetes mellitus without complications: Secondary | ICD-10-CM | POA: Diagnosis not present

## 2015-10-07 DIAGNOSIS — J418 Mixed simple and mucopurulent chronic bronchitis: Secondary | ICD-10-CM | POA: Diagnosis not present

## 2015-10-07 DIAGNOSIS — K5909 Other constipation: Secondary | ICD-10-CM | POA: Diagnosis present

## 2015-10-07 DIAGNOSIS — F068 Other specified mental disorders due to known physiological condition: Secondary | ICD-10-CM | POA: Diagnosis present

## 2015-10-07 DIAGNOSIS — Z79899 Other long term (current) drug therapy: Secondary | ICD-10-CM | POA: Diagnosis not present

## 2015-10-07 DIAGNOSIS — E038 Other specified hypothyroidism: Secondary | ICD-10-CM | POA: Diagnosis not present

## 2015-10-07 DIAGNOSIS — Z87891 Personal history of nicotine dependence: Secondary | ICD-10-CM | POA: Diagnosis not present

## 2015-10-07 DIAGNOSIS — Z955 Presence of coronary angioplasty implant and graft: Secondary | ICD-10-CM | POA: Diagnosis not present

## 2015-10-07 DIAGNOSIS — F05 Delirium due to known physiological condition: Secondary | ICD-10-CM | POA: Diagnosis present

## 2015-10-07 DIAGNOSIS — F0281 Dementia in other diseases classified elsewhere with behavioral disturbance: Secondary | ICD-10-CM | POA: Diagnosis not present

## 2015-10-18 DIAGNOSIS — E038 Other specified hypothyroidism: Secondary | ICD-10-CM | POA: Diagnosis not present

## 2015-10-18 DIAGNOSIS — F062 Psychotic disorder with delusions due to known physiological condition: Secondary | ICD-10-CM | POA: Diagnosis not present

## 2015-10-18 DIAGNOSIS — F329 Major depressive disorder, single episode, unspecified: Secondary | ICD-10-CM | POA: Diagnosis not present

## 2015-10-18 DIAGNOSIS — I2510 Atherosclerotic heart disease of native coronary artery without angina pectoris: Secondary | ICD-10-CM | POA: Diagnosis not present

## 2015-10-18 DIAGNOSIS — E119 Type 2 diabetes mellitus without complications: Secondary | ICD-10-CM | POA: Diagnosis not present

## 2015-10-20 DIAGNOSIS — J449 Chronic obstructive pulmonary disease, unspecified: Secondary | ICD-10-CM | POA: Diagnosis not present

## 2015-10-20 DIAGNOSIS — E039 Hypothyroidism, unspecified: Secondary | ICD-10-CM | POA: Diagnosis not present

## 2015-10-20 DIAGNOSIS — F329 Major depressive disorder, single episode, unspecified: Secondary | ICD-10-CM | POA: Diagnosis not present

## 2015-10-20 DIAGNOSIS — F419 Anxiety disorder, unspecified: Secondary | ICD-10-CM | POA: Diagnosis not present

## 2015-10-20 DIAGNOSIS — G4700 Insomnia, unspecified: Secondary | ICD-10-CM | POA: Diagnosis not present

## 2015-10-20 DIAGNOSIS — Z794 Long term (current) use of insulin: Secondary | ICD-10-CM | POA: Diagnosis not present

## 2015-10-20 DIAGNOSIS — Z7982 Long term (current) use of aspirin: Secondary | ICD-10-CM | POA: Diagnosis not present

## 2015-10-20 DIAGNOSIS — E119 Type 2 diabetes mellitus without complications: Secondary | ICD-10-CM | POA: Diagnosis not present

## 2015-10-20 DIAGNOSIS — M150 Primary generalized (osteo)arthritis: Secondary | ICD-10-CM | POA: Diagnosis not present

## 2015-10-20 DIAGNOSIS — I2510 Atherosclerotic heart disease of native coronary artery without angina pectoris: Secondary | ICD-10-CM | POA: Diagnosis not present

## 2015-10-20 DIAGNOSIS — I739 Peripheral vascular disease, unspecified: Secondary | ICD-10-CM | POA: Diagnosis not present

## 2015-10-20 DIAGNOSIS — M47812 Spondylosis without myelopathy or radiculopathy, cervical region: Secondary | ICD-10-CM | POA: Diagnosis not present

## 2015-10-20 DIAGNOSIS — F0390 Unspecified dementia without behavioral disturbance: Secondary | ICD-10-CM | POA: Diagnosis not present

## 2015-10-20 DIAGNOSIS — M8580 Other specified disorders of bone density and structure, unspecified site: Secondary | ICD-10-CM | POA: Diagnosis not present

## 2015-10-20 DIAGNOSIS — Z87891 Personal history of nicotine dependence: Secondary | ICD-10-CM | POA: Diagnosis not present

## 2015-10-20 DIAGNOSIS — I10 Essential (primary) hypertension: Secondary | ICD-10-CM | POA: Diagnosis not present

## 2015-10-20 DIAGNOSIS — M545 Low back pain: Secondary | ICD-10-CM | POA: Diagnosis not present

## 2015-10-24 DIAGNOSIS — I350 Nonrheumatic aortic (valve) stenosis: Secondary | ICD-10-CM | POA: Diagnosis not present

## 2015-10-25 DIAGNOSIS — M150 Primary generalized (osteo)arthritis: Secondary | ICD-10-CM | POA: Diagnosis not present

## 2015-10-25 DIAGNOSIS — M47812 Spondylosis without myelopathy or radiculopathy, cervical region: Secondary | ICD-10-CM | POA: Diagnosis not present

## 2015-10-25 DIAGNOSIS — I2510 Atherosclerotic heart disease of native coronary artery without angina pectoris: Secondary | ICD-10-CM | POA: Diagnosis not present

## 2015-10-25 DIAGNOSIS — I350 Nonrheumatic aortic (valve) stenosis: Secondary | ICD-10-CM | POA: Diagnosis not present

## 2015-10-25 DIAGNOSIS — E119 Type 2 diabetes mellitus without complications: Secondary | ICD-10-CM | POA: Diagnosis not present

## 2015-10-25 DIAGNOSIS — M545 Low back pain: Secondary | ICD-10-CM | POA: Diagnosis not present

## 2015-10-25 DIAGNOSIS — I10 Essential (primary) hypertension: Secondary | ICD-10-CM | POA: Diagnosis not present

## 2015-11-03 DIAGNOSIS — E119 Type 2 diabetes mellitus without complications: Secondary | ICD-10-CM | POA: Diagnosis not present

## 2015-11-03 DIAGNOSIS — M47812 Spondylosis without myelopathy or radiculopathy, cervical region: Secondary | ICD-10-CM | POA: Diagnosis not present

## 2015-11-03 DIAGNOSIS — M150 Primary generalized (osteo)arthritis: Secondary | ICD-10-CM | POA: Diagnosis not present

## 2015-11-03 DIAGNOSIS — I2510 Atherosclerotic heart disease of native coronary artery without angina pectoris: Secondary | ICD-10-CM | POA: Diagnosis not present

## 2015-11-03 DIAGNOSIS — M545 Low back pain: Secondary | ICD-10-CM | POA: Diagnosis not present

## 2015-11-03 DIAGNOSIS — I10 Essential (primary) hypertension: Secondary | ICD-10-CM | POA: Diagnosis not present

## 2015-11-07 DIAGNOSIS — I10 Essential (primary) hypertension: Secondary | ICD-10-CM | POA: Diagnosis not present

## 2015-11-07 DIAGNOSIS — E119 Type 2 diabetes mellitus without complications: Secondary | ICD-10-CM | POA: Diagnosis not present

## 2015-11-07 DIAGNOSIS — M47812 Spondylosis without myelopathy or radiculopathy, cervical region: Secondary | ICD-10-CM | POA: Diagnosis not present

## 2015-11-07 DIAGNOSIS — Z1159 Encounter for screening for other viral diseases: Secondary | ICD-10-CM | POA: Diagnosis not present

## 2015-11-07 DIAGNOSIS — Z780 Asymptomatic menopausal state: Secondary | ICD-10-CM | POA: Diagnosis not present

## 2015-11-07 DIAGNOSIS — M150 Primary generalized (osteo)arthritis: Secondary | ICD-10-CM | POA: Diagnosis not present

## 2015-11-07 DIAGNOSIS — E0590 Thyrotoxicosis, unspecified without thyrotoxic crisis or storm: Secondary | ICD-10-CM | POA: Diagnosis not present

## 2015-11-07 DIAGNOSIS — I2510 Atherosclerotic heart disease of native coronary artery without angina pectoris: Secondary | ICD-10-CM | POA: Diagnosis not present

## 2015-11-07 DIAGNOSIS — D510 Vitamin B12 deficiency anemia due to intrinsic factor deficiency: Secondary | ICD-10-CM | POA: Diagnosis not present

## 2015-11-07 DIAGNOSIS — M545 Low back pain: Secondary | ICD-10-CM | POA: Diagnosis not present

## 2015-11-07 DIAGNOSIS — Z981 Arthrodesis status: Secondary | ICD-10-CM | POA: Diagnosis not present

## 2015-11-07 DIAGNOSIS — Z79899 Other long term (current) drug therapy: Secondary | ICD-10-CM | POA: Diagnosis not present

## 2015-11-13 DIAGNOSIS — M150 Primary generalized (osteo)arthritis: Secondary | ICD-10-CM | POA: Diagnosis not present

## 2015-11-13 DIAGNOSIS — I2510 Atherosclerotic heart disease of native coronary artery without angina pectoris: Secondary | ICD-10-CM | POA: Diagnosis not present

## 2015-11-13 DIAGNOSIS — E119 Type 2 diabetes mellitus without complications: Secondary | ICD-10-CM | POA: Diagnosis not present

## 2015-11-13 DIAGNOSIS — M47812 Spondylosis without myelopathy or radiculopathy, cervical region: Secondary | ICD-10-CM | POA: Diagnosis not present

## 2015-11-13 DIAGNOSIS — M545 Low back pain: Secondary | ICD-10-CM | POA: Diagnosis not present

## 2015-11-13 DIAGNOSIS — I10 Essential (primary) hypertension: Secondary | ICD-10-CM | POA: Diagnosis not present

## 2015-11-30 DIAGNOSIS — M47812 Spondylosis without myelopathy or radiculopathy, cervical region: Secondary | ICD-10-CM | POA: Diagnosis not present

## 2015-11-30 DIAGNOSIS — E119 Type 2 diabetes mellitus without complications: Secondary | ICD-10-CM | POA: Diagnosis not present

## 2015-11-30 DIAGNOSIS — I2510 Atherosclerotic heart disease of native coronary artery without angina pectoris: Secondary | ICD-10-CM | POA: Diagnosis not present

## 2015-11-30 DIAGNOSIS — I10 Essential (primary) hypertension: Secondary | ICD-10-CM | POA: Diagnosis not present

## 2015-11-30 DIAGNOSIS — M150 Primary generalized (osteo)arthritis: Secondary | ICD-10-CM | POA: Diagnosis not present

## 2015-11-30 DIAGNOSIS — M545 Low back pain: Secondary | ICD-10-CM | POA: Diagnosis not present

## 2015-12-07 DIAGNOSIS — D510 Vitamin B12 deficiency anemia due to intrinsic factor deficiency: Secondary | ICD-10-CM | POA: Diagnosis not present

## 2015-12-07 DIAGNOSIS — J418 Mixed simple and mucopurulent chronic bronchitis: Secondary | ICD-10-CM | POA: Diagnosis not present

## 2015-12-07 DIAGNOSIS — M150 Primary generalized (osteo)arthritis: Secondary | ICD-10-CM | POA: Diagnosis not present

## 2015-12-07 DIAGNOSIS — E039 Hypothyroidism, unspecified: Secondary | ICD-10-CM | POA: Diagnosis not present

## 2015-12-07 DIAGNOSIS — E038 Other specified hypothyroidism: Secondary | ICD-10-CM | POA: Diagnosis not present

## 2015-12-07 DIAGNOSIS — M545 Low back pain: Secondary | ICD-10-CM | POA: Diagnosis not present

## 2015-12-07 DIAGNOSIS — Z23 Encounter for immunization: Secondary | ICD-10-CM | POA: Diagnosis not present

## 2015-12-07 DIAGNOSIS — I6523 Occlusion and stenosis of bilateral carotid arteries: Secondary | ICD-10-CM | POA: Diagnosis not present

## 2015-12-07 DIAGNOSIS — Z79899 Other long term (current) drug therapy: Secondary | ICD-10-CM | POA: Diagnosis not present

## 2015-12-07 DIAGNOSIS — M47812 Spondylosis without myelopathy or radiculopathy, cervical region: Secondary | ICD-10-CM | POA: Diagnosis not present

## 2015-12-07 DIAGNOSIS — I2510 Atherosclerotic heart disease of native coronary artery without angina pectoris: Secondary | ICD-10-CM | POA: Diagnosis not present

## 2015-12-07 DIAGNOSIS — E119 Type 2 diabetes mellitus without complications: Secondary | ICD-10-CM | POA: Diagnosis not present

## 2015-12-07 DIAGNOSIS — I10 Essential (primary) hypertension: Secondary | ICD-10-CM | POA: Diagnosis not present

## 2015-12-14 DIAGNOSIS — I10 Essential (primary) hypertension: Secondary | ICD-10-CM | POA: Diagnosis not present

## 2015-12-14 DIAGNOSIS — I2510 Atherosclerotic heart disease of native coronary artery without angina pectoris: Secondary | ICD-10-CM | POA: Diagnosis not present

## 2015-12-14 DIAGNOSIS — E119 Type 2 diabetes mellitus without complications: Secondary | ICD-10-CM | POA: Diagnosis not present

## 2015-12-14 DIAGNOSIS — M150 Primary generalized (osteo)arthritis: Secondary | ICD-10-CM | POA: Diagnosis not present

## 2015-12-14 DIAGNOSIS — M545 Low back pain: Secondary | ICD-10-CM | POA: Diagnosis not present

## 2015-12-14 DIAGNOSIS — M47812 Spondylosis without myelopathy or radiculopathy, cervical region: Secondary | ICD-10-CM | POA: Diagnosis not present

## 2016-01-14 DIAGNOSIS — R51 Headache: Secondary | ICD-10-CM | POA: Diagnosis not present

## 2016-01-14 DIAGNOSIS — R05 Cough: Secondary | ICD-10-CM | POA: Diagnosis not present

## 2016-01-22 DIAGNOSIS — G8929 Other chronic pain: Secondary | ICD-10-CM | POA: Diagnosis not present

## 2016-01-22 DIAGNOSIS — I6523 Occlusion and stenosis of bilateral carotid arteries: Secondary | ICD-10-CM | POA: Diagnosis not present

## 2016-01-22 DIAGNOSIS — I10 Essential (primary) hypertension: Secondary | ICD-10-CM | POA: Diagnosis not present

## 2016-01-22 DIAGNOSIS — M5441 Lumbago with sciatica, right side: Secondary | ICD-10-CM | POA: Diagnosis not present

## 2016-01-22 DIAGNOSIS — M5442 Lumbago with sciatica, left side: Secondary | ICD-10-CM | POA: Diagnosis not present

## 2016-01-24 DIAGNOSIS — I10 Essential (primary) hypertension: Secondary | ICD-10-CM | POA: Diagnosis not present

## 2016-01-24 DIAGNOSIS — E1165 Type 2 diabetes mellitus with hyperglycemia: Secondary | ICD-10-CM | POA: Diagnosis not present

## 2016-01-24 DIAGNOSIS — Z79899 Other long term (current) drug therapy: Secondary | ICD-10-CM | POA: Diagnosis not present

## 2016-01-24 DIAGNOSIS — R413 Other amnesia: Secondary | ICD-10-CM | POA: Diagnosis not present

## 2016-01-24 DIAGNOSIS — I6523 Occlusion and stenosis of bilateral carotid arteries: Secondary | ICD-10-CM | POA: Diagnosis not present

## 2016-01-27 DIAGNOSIS — J449 Chronic obstructive pulmonary disease, unspecified: Secondary | ICD-10-CM | POA: Diagnosis not present

## 2016-01-27 DIAGNOSIS — F329 Major depressive disorder, single episode, unspecified: Secondary | ICD-10-CM | POA: Diagnosis not present

## 2016-01-27 DIAGNOSIS — E1165 Type 2 diabetes mellitus with hyperglycemia: Secondary | ICD-10-CM | POA: Diagnosis not present

## 2016-01-27 DIAGNOSIS — E1151 Type 2 diabetes mellitus with diabetic peripheral angiopathy without gangrene: Secondary | ICD-10-CM | POA: Diagnosis not present

## 2016-01-27 DIAGNOSIS — I2510 Atherosclerotic heart disease of native coronary artery without angina pectoris: Secondary | ICD-10-CM | POA: Diagnosis not present

## 2016-01-27 DIAGNOSIS — I10 Essential (primary) hypertension: Secondary | ICD-10-CM | POA: Diagnosis not present

## 2016-01-29 DIAGNOSIS — Z780 Asymptomatic menopausal state: Secondary | ICD-10-CM | POA: Diagnosis not present

## 2016-02-05 DIAGNOSIS — E1151 Type 2 diabetes mellitus with diabetic peripheral angiopathy without gangrene: Secondary | ICD-10-CM | POA: Diagnosis not present

## 2016-02-05 DIAGNOSIS — I2510 Atherosclerotic heart disease of native coronary artery without angina pectoris: Secondary | ICD-10-CM | POA: Diagnosis not present

## 2016-02-05 DIAGNOSIS — E1165 Type 2 diabetes mellitus with hyperglycemia: Secondary | ICD-10-CM | POA: Diagnosis not present

## 2016-02-05 DIAGNOSIS — F329 Major depressive disorder, single episode, unspecified: Secondary | ICD-10-CM | POA: Diagnosis not present

## 2016-02-05 DIAGNOSIS — J449 Chronic obstructive pulmonary disease, unspecified: Secondary | ICD-10-CM | POA: Diagnosis not present

## 2016-02-05 DIAGNOSIS — I10 Essential (primary) hypertension: Secondary | ICD-10-CM | POA: Diagnosis not present

## 2016-02-08 DIAGNOSIS — E119 Type 2 diabetes mellitus without complications: Secondary | ICD-10-CM | POA: Diagnosis not present

## 2016-02-08 DIAGNOSIS — E039 Hypothyroidism, unspecified: Secondary | ICD-10-CM | POA: Diagnosis not present

## 2016-02-08 DIAGNOSIS — I10 Essential (primary) hypertension: Secondary | ICD-10-CM | POA: Diagnosis not present

## 2016-02-08 DIAGNOSIS — D510 Vitamin B12 deficiency anemia due to intrinsic factor deficiency: Secondary | ICD-10-CM | POA: Diagnosis not present

## 2016-02-08 DIAGNOSIS — J418 Mixed simple and mucopurulent chronic bronchitis: Secondary | ICD-10-CM | POA: Diagnosis not present

## 2016-02-20 DIAGNOSIS — F329 Major depressive disorder, single episode, unspecified: Secondary | ICD-10-CM | POA: Diagnosis not present

## 2016-02-20 DIAGNOSIS — E1151 Type 2 diabetes mellitus with diabetic peripheral angiopathy without gangrene: Secondary | ICD-10-CM | POA: Diagnosis not present

## 2016-02-20 DIAGNOSIS — I10 Essential (primary) hypertension: Secondary | ICD-10-CM | POA: Diagnosis not present

## 2016-02-20 DIAGNOSIS — J449 Chronic obstructive pulmonary disease, unspecified: Secondary | ICD-10-CM | POA: Diagnosis not present

## 2016-02-20 DIAGNOSIS — E1165 Type 2 diabetes mellitus with hyperglycemia: Secondary | ICD-10-CM | POA: Diagnosis not present

## 2016-02-20 DIAGNOSIS — I2510 Atherosclerotic heart disease of native coronary artery without angina pectoris: Secondary | ICD-10-CM | POA: Diagnosis not present

## 2016-02-27 DIAGNOSIS — E1165 Type 2 diabetes mellitus with hyperglycemia: Secondary | ICD-10-CM | POA: Diagnosis not present

## 2016-02-27 DIAGNOSIS — I2510 Atherosclerotic heart disease of native coronary artery without angina pectoris: Secondary | ICD-10-CM | POA: Diagnosis not present

## 2016-02-27 DIAGNOSIS — F329 Major depressive disorder, single episode, unspecified: Secondary | ICD-10-CM | POA: Diagnosis not present

## 2016-02-27 DIAGNOSIS — J449 Chronic obstructive pulmonary disease, unspecified: Secondary | ICD-10-CM | POA: Diagnosis not present

## 2016-02-27 DIAGNOSIS — I10 Essential (primary) hypertension: Secondary | ICD-10-CM | POA: Diagnosis not present

## 2016-02-27 DIAGNOSIS — E1151 Type 2 diabetes mellitus with diabetic peripheral angiopathy without gangrene: Secondary | ICD-10-CM | POA: Diagnosis not present

## 2016-03-07 DIAGNOSIS — E1165 Type 2 diabetes mellitus with hyperglycemia: Secondary | ICD-10-CM | POA: Diagnosis not present

## 2016-03-07 DIAGNOSIS — E1151 Type 2 diabetes mellitus with diabetic peripheral angiopathy without gangrene: Secondary | ICD-10-CM | POA: Diagnosis not present

## 2016-03-07 DIAGNOSIS — I2510 Atherosclerotic heart disease of native coronary artery without angina pectoris: Secondary | ICD-10-CM | POA: Diagnosis not present

## 2016-03-07 DIAGNOSIS — J449 Chronic obstructive pulmonary disease, unspecified: Secondary | ICD-10-CM | POA: Diagnosis not present

## 2016-03-07 DIAGNOSIS — I10 Essential (primary) hypertension: Secondary | ICD-10-CM | POA: Diagnosis not present

## 2016-03-07 DIAGNOSIS — F329 Major depressive disorder, single episode, unspecified: Secondary | ICD-10-CM | POA: Diagnosis not present

## 2016-03-13 DIAGNOSIS — I10 Essential (primary) hypertension: Secondary | ICD-10-CM | POA: Diagnosis not present

## 2016-03-13 DIAGNOSIS — E1165 Type 2 diabetes mellitus with hyperglycemia: Secondary | ICD-10-CM | POA: Diagnosis not present

## 2016-03-13 DIAGNOSIS — F329 Major depressive disorder, single episode, unspecified: Secondary | ICD-10-CM | POA: Diagnosis not present

## 2016-03-13 DIAGNOSIS — I2510 Atherosclerotic heart disease of native coronary artery without angina pectoris: Secondary | ICD-10-CM | POA: Diagnosis not present

## 2016-03-13 DIAGNOSIS — J449 Chronic obstructive pulmonary disease, unspecified: Secondary | ICD-10-CM | POA: Diagnosis not present

## 2016-03-13 DIAGNOSIS — E1151 Type 2 diabetes mellitus with diabetic peripheral angiopathy without gangrene: Secondary | ICD-10-CM | POA: Diagnosis not present

## 2016-03-19 DIAGNOSIS — I2510 Atherosclerotic heart disease of native coronary artery without angina pectoris: Secondary | ICD-10-CM | POA: Diagnosis not present

## 2016-03-19 DIAGNOSIS — F329 Major depressive disorder, single episode, unspecified: Secondary | ICD-10-CM | POA: Diagnosis not present

## 2016-03-19 DIAGNOSIS — E1165 Type 2 diabetes mellitus with hyperglycemia: Secondary | ICD-10-CM | POA: Diagnosis not present

## 2016-03-19 DIAGNOSIS — E1151 Type 2 diabetes mellitus with diabetic peripheral angiopathy without gangrene: Secondary | ICD-10-CM | POA: Diagnosis not present

## 2016-03-19 DIAGNOSIS — J449 Chronic obstructive pulmonary disease, unspecified: Secondary | ICD-10-CM | POA: Diagnosis not present

## 2016-03-19 DIAGNOSIS — I10 Essential (primary) hypertension: Secondary | ICD-10-CM | POA: Diagnosis not present

## 2016-03-26 DIAGNOSIS — J449 Chronic obstructive pulmonary disease, unspecified: Secondary | ICD-10-CM | POA: Diagnosis not present

## 2016-03-26 DIAGNOSIS — F329 Major depressive disorder, single episode, unspecified: Secondary | ICD-10-CM | POA: Diagnosis not present

## 2016-03-26 DIAGNOSIS — E1165 Type 2 diabetes mellitus with hyperglycemia: Secondary | ICD-10-CM | POA: Diagnosis not present

## 2016-03-26 DIAGNOSIS — E1151 Type 2 diabetes mellitus with diabetic peripheral angiopathy without gangrene: Secondary | ICD-10-CM | POA: Diagnosis not present

## 2016-03-26 DIAGNOSIS — I2510 Atherosclerotic heart disease of native coronary artery without angina pectoris: Secondary | ICD-10-CM | POA: Diagnosis not present

## 2016-03-26 DIAGNOSIS — I10 Essential (primary) hypertension: Secondary | ICD-10-CM | POA: Diagnosis not present

## 2016-03-27 DIAGNOSIS — J449 Chronic obstructive pulmonary disease, unspecified: Secondary | ICD-10-CM | POA: Diagnosis not present

## 2016-03-27 DIAGNOSIS — E1151 Type 2 diabetes mellitus with diabetic peripheral angiopathy without gangrene: Secondary | ICD-10-CM | POA: Diagnosis not present

## 2016-03-27 DIAGNOSIS — I10 Essential (primary) hypertension: Secondary | ICD-10-CM | POA: Diagnosis not present

## 2016-03-27 DIAGNOSIS — M8580 Other specified disorders of bone density and structure, unspecified site: Secondary | ICD-10-CM | POA: Diagnosis not present

## 2016-03-27 DIAGNOSIS — M47812 Spondylosis without myelopathy or radiculopathy, cervical region: Secondary | ICD-10-CM | POA: Diagnosis not present

## 2016-03-27 DIAGNOSIS — I2510 Atherosclerotic heart disease of native coronary artery without angina pectoris: Secondary | ICD-10-CM | POA: Diagnosis not present

## 2016-04-03 DIAGNOSIS — E0590 Thyrotoxicosis, unspecified without thyrotoxic crisis or storm: Secondary | ICD-10-CM | POA: Diagnosis not present

## 2016-04-03 DIAGNOSIS — D510 Vitamin B12 deficiency anemia due to intrinsic factor deficiency: Secondary | ICD-10-CM | POA: Diagnosis not present

## 2016-04-03 DIAGNOSIS — E119 Type 2 diabetes mellitus without complications: Secondary | ICD-10-CM | POA: Diagnosis not present

## 2016-04-03 DIAGNOSIS — E1122 Type 2 diabetes mellitus with diabetic chronic kidney disease: Secondary | ICD-10-CM | POA: Diagnosis not present

## 2016-04-03 DIAGNOSIS — E7800 Pure hypercholesterolemia, unspecified: Secondary | ICD-10-CM | POA: Diagnosis not present

## 2016-04-03 DIAGNOSIS — I2510 Atherosclerotic heart disease of native coronary artery without angina pectoris: Secondary | ICD-10-CM | POA: Diagnosis not present

## 2016-04-03 DIAGNOSIS — I10 Essential (primary) hypertension: Secondary | ICD-10-CM | POA: Diagnosis not present

## 2016-04-03 DIAGNOSIS — I739 Peripheral vascular disease, unspecified: Secondary | ICD-10-CM | POA: Diagnosis not present

## 2016-04-03 DIAGNOSIS — N183 Chronic kidney disease, stage 3 (moderate): Secondary | ICD-10-CM | POA: Diagnosis not present

## 2016-04-03 DIAGNOSIS — E039 Hypothyroidism, unspecified: Secondary | ICD-10-CM | POA: Diagnosis not present

## 2016-04-04 DIAGNOSIS — J449 Chronic obstructive pulmonary disease, unspecified: Secondary | ICD-10-CM | POA: Diagnosis not present

## 2016-04-04 DIAGNOSIS — I10 Essential (primary) hypertension: Secondary | ICD-10-CM | POA: Diagnosis not present

## 2016-04-04 DIAGNOSIS — M8580 Other specified disorders of bone density and structure, unspecified site: Secondary | ICD-10-CM | POA: Diagnosis not present

## 2016-04-04 DIAGNOSIS — I2510 Atherosclerotic heart disease of native coronary artery without angina pectoris: Secondary | ICD-10-CM | POA: Diagnosis not present

## 2016-04-04 DIAGNOSIS — M47812 Spondylosis without myelopathy or radiculopathy, cervical region: Secondary | ICD-10-CM | POA: Diagnosis not present

## 2016-04-04 DIAGNOSIS — E1151 Type 2 diabetes mellitus with diabetic peripheral angiopathy without gangrene: Secondary | ICD-10-CM | POA: Diagnosis not present

## 2016-04-04 DIAGNOSIS — E119 Type 2 diabetes mellitus without complications: Secondary | ICD-10-CM | POA: Diagnosis not present

## 2016-04-09 DIAGNOSIS — J449 Chronic obstructive pulmonary disease, unspecified: Secondary | ICD-10-CM | POA: Diagnosis not present

## 2016-04-09 DIAGNOSIS — I10 Essential (primary) hypertension: Secondary | ICD-10-CM | POA: Diagnosis not present

## 2016-04-09 DIAGNOSIS — I2510 Atherosclerotic heart disease of native coronary artery without angina pectoris: Secondary | ICD-10-CM | POA: Diagnosis not present

## 2016-04-09 DIAGNOSIS — M47812 Spondylosis without myelopathy or radiculopathy, cervical region: Secondary | ICD-10-CM | POA: Diagnosis not present

## 2016-04-09 DIAGNOSIS — E1151 Type 2 diabetes mellitus with diabetic peripheral angiopathy without gangrene: Secondary | ICD-10-CM | POA: Diagnosis not present

## 2016-04-09 DIAGNOSIS — M8580 Other specified disorders of bone density and structure, unspecified site: Secondary | ICD-10-CM | POA: Diagnosis not present

## 2016-04-16 DIAGNOSIS — M47812 Spondylosis without myelopathy or radiculopathy, cervical region: Secondary | ICD-10-CM | POA: Diagnosis not present

## 2016-04-16 DIAGNOSIS — J449 Chronic obstructive pulmonary disease, unspecified: Secondary | ICD-10-CM | POA: Diagnosis not present

## 2016-04-16 DIAGNOSIS — I10 Essential (primary) hypertension: Secondary | ICD-10-CM | POA: Diagnosis not present

## 2016-04-16 DIAGNOSIS — E1151 Type 2 diabetes mellitus with diabetic peripheral angiopathy without gangrene: Secondary | ICD-10-CM | POA: Diagnosis not present

## 2016-04-16 DIAGNOSIS — M8580 Other specified disorders of bone density and structure, unspecified site: Secondary | ICD-10-CM | POA: Diagnosis not present

## 2016-04-16 DIAGNOSIS — I2510 Atherosclerotic heart disease of native coronary artery without angina pectoris: Secondary | ICD-10-CM | POA: Diagnosis not present

## 2016-04-18 DIAGNOSIS — E782 Mixed hyperlipidemia: Secondary | ICD-10-CM | POA: Diagnosis not present

## 2016-04-18 DIAGNOSIS — I2510 Atherosclerotic heart disease of native coronary artery without angina pectoris: Secondary | ICD-10-CM | POA: Diagnosis not present

## 2016-04-23 DIAGNOSIS — M8580 Other specified disorders of bone density and structure, unspecified site: Secondary | ICD-10-CM | POA: Diagnosis not present

## 2016-04-23 DIAGNOSIS — H35033 Hypertensive retinopathy, bilateral: Secondary | ICD-10-CM | POA: Diagnosis not present

## 2016-04-23 DIAGNOSIS — I10 Essential (primary) hypertension: Secondary | ICD-10-CM | POA: Diagnosis not present

## 2016-04-23 DIAGNOSIS — M47812 Spondylosis without myelopathy or radiculopathy, cervical region: Secondary | ICD-10-CM | POA: Diagnosis not present

## 2016-04-23 DIAGNOSIS — I2510 Atherosclerotic heart disease of native coronary artery without angina pectoris: Secondary | ICD-10-CM | POA: Diagnosis not present

## 2016-04-23 DIAGNOSIS — E1151 Type 2 diabetes mellitus with diabetic peripheral angiopathy without gangrene: Secondary | ICD-10-CM | POA: Diagnosis not present

## 2016-04-23 DIAGNOSIS — H538 Other visual disturbances: Secondary | ICD-10-CM | POA: Diagnosis not present

## 2016-04-23 DIAGNOSIS — J449 Chronic obstructive pulmonary disease, unspecified: Secondary | ICD-10-CM | POA: Diagnosis not present

## 2016-05-01 DIAGNOSIS — M8580 Other specified disorders of bone density and structure, unspecified site: Secondary | ICD-10-CM | POA: Diagnosis not present

## 2016-05-01 DIAGNOSIS — M47812 Spondylosis without myelopathy or radiculopathy, cervical region: Secondary | ICD-10-CM | POA: Diagnosis not present

## 2016-05-01 DIAGNOSIS — I2510 Atherosclerotic heart disease of native coronary artery without angina pectoris: Secondary | ICD-10-CM | POA: Diagnosis not present

## 2016-05-01 DIAGNOSIS — E1151 Type 2 diabetes mellitus with diabetic peripheral angiopathy without gangrene: Secondary | ICD-10-CM | POA: Diagnosis not present

## 2016-05-01 DIAGNOSIS — J449 Chronic obstructive pulmonary disease, unspecified: Secondary | ICD-10-CM | POA: Diagnosis not present

## 2016-05-01 DIAGNOSIS — I10 Essential (primary) hypertension: Secondary | ICD-10-CM | POA: Diagnosis not present

## 2016-05-02 DIAGNOSIS — I2510 Atherosclerotic heart disease of native coronary artery without angina pectoris: Secondary | ICD-10-CM | POA: Diagnosis not present

## 2016-05-02 DIAGNOSIS — I10 Essential (primary) hypertension: Secondary | ICD-10-CM | POA: Diagnosis not present

## 2016-05-02 DIAGNOSIS — G8929 Other chronic pain: Secondary | ICD-10-CM | POA: Diagnosis not present

## 2016-05-02 DIAGNOSIS — M545 Low back pain: Secondary | ICD-10-CM | POA: Diagnosis not present

## 2016-07-02 DIAGNOSIS — D510 Vitamin B12 deficiency anemia due to intrinsic factor deficiency: Secondary | ICD-10-CM | POA: Diagnosis not present

## 2016-07-02 DIAGNOSIS — E039 Hypothyroidism, unspecified: Secondary | ICD-10-CM | POA: Diagnosis not present

## 2016-07-02 DIAGNOSIS — Z79899 Other long term (current) drug therapy: Secondary | ICD-10-CM | POA: Diagnosis not present

## 2016-07-02 DIAGNOSIS — E7800 Pure hypercholesterolemia, unspecified: Secondary | ICD-10-CM | POA: Diagnosis not present

## 2016-07-02 DIAGNOSIS — R270 Ataxia, unspecified: Secondary | ICD-10-CM | POA: Diagnosis not present

## 2016-07-02 DIAGNOSIS — E1165 Type 2 diabetes mellitus with hyperglycemia: Secondary | ICD-10-CM | POA: Diagnosis not present

## 2016-07-02 DIAGNOSIS — I10 Essential (primary) hypertension: Secondary | ICD-10-CM | POA: Diagnosis not present

## 2016-07-02 DIAGNOSIS — I2510 Atherosclerotic heart disease of native coronary artery without angina pectoris: Secondary | ICD-10-CM | POA: Diagnosis not present

## 2016-07-16 DIAGNOSIS — E113551 Type 2 diabetes mellitus with stable proliferative diabetic retinopathy, right eye: Secondary | ICD-10-CM | POA: Diagnosis not present

## 2016-10-08 DIAGNOSIS — E113551 Type 2 diabetes mellitus with stable proliferative diabetic retinopathy, right eye: Secondary | ICD-10-CM | POA: Diagnosis not present

## 2016-10-17 DIAGNOSIS — F0281 Dementia in other diseases classified elsewhere with behavioral disturbance: Secondary | ICD-10-CM | POA: Diagnosis not present

## 2016-10-17 DIAGNOSIS — E7800 Pure hypercholesterolemia, unspecified: Secondary | ICD-10-CM | POA: Diagnosis not present

## 2016-10-17 DIAGNOSIS — I10 Essential (primary) hypertension: Secondary | ICD-10-CM | POA: Diagnosis not present

## 2016-10-17 DIAGNOSIS — E1165 Type 2 diabetes mellitus with hyperglycemia: Secondary | ICD-10-CM | POA: Diagnosis not present

## 2016-10-17 DIAGNOSIS — I2510 Atherosclerotic heart disease of native coronary artery without angina pectoris: Secondary | ICD-10-CM | POA: Diagnosis not present

## 2016-10-17 DIAGNOSIS — I679 Cerebrovascular disease, unspecified: Secondary | ICD-10-CM | POA: Diagnosis not present

## 2016-10-17 DIAGNOSIS — R270 Ataxia, unspecified: Secondary | ICD-10-CM | POA: Diagnosis not present

## 2016-10-22 DIAGNOSIS — M47812 Spondylosis without myelopathy or radiculopathy, cervical region: Secondary | ICD-10-CM | POA: Diagnosis not present

## 2016-10-22 DIAGNOSIS — E1151 Type 2 diabetes mellitus with diabetic peripheral angiopathy without gangrene: Secondary | ICD-10-CM | POA: Diagnosis not present

## 2016-10-22 DIAGNOSIS — J449 Chronic obstructive pulmonary disease, unspecified: Secondary | ICD-10-CM | POA: Diagnosis not present

## 2016-10-22 DIAGNOSIS — I10 Essential (primary) hypertension: Secondary | ICD-10-CM | POA: Diagnosis not present

## 2016-10-22 DIAGNOSIS — R270 Ataxia, unspecified: Secondary | ICD-10-CM | POA: Diagnosis not present

## 2016-10-22 DIAGNOSIS — I2510 Atherosclerotic heart disease of native coronary artery without angina pectoris: Secondary | ICD-10-CM | POA: Diagnosis not present

## 2016-10-23 DIAGNOSIS — I2510 Atherosclerotic heart disease of native coronary artery without angina pectoris: Secondary | ICD-10-CM | POA: Diagnosis not present

## 2016-10-23 DIAGNOSIS — I10 Essential (primary) hypertension: Secondary | ICD-10-CM | POA: Diagnosis not present

## 2016-10-23 DIAGNOSIS — J449 Chronic obstructive pulmonary disease, unspecified: Secondary | ICD-10-CM | POA: Diagnosis not present

## 2016-10-23 DIAGNOSIS — M47812 Spondylosis without myelopathy or radiculopathy, cervical region: Secondary | ICD-10-CM | POA: Diagnosis not present

## 2016-10-23 DIAGNOSIS — R270 Ataxia, unspecified: Secondary | ICD-10-CM | POA: Diagnosis not present

## 2016-10-23 DIAGNOSIS — E1151 Type 2 diabetes mellitus with diabetic peripheral angiopathy without gangrene: Secondary | ICD-10-CM | POA: Diagnosis not present

## 2016-11-09 DIAGNOSIS — M47812 Spondylosis without myelopathy or radiculopathy, cervical region: Secondary | ICD-10-CM | POA: Diagnosis not present

## 2016-11-09 DIAGNOSIS — J449 Chronic obstructive pulmonary disease, unspecified: Secondary | ICD-10-CM | POA: Diagnosis not present

## 2016-11-09 DIAGNOSIS — R270 Ataxia, unspecified: Secondary | ICD-10-CM | POA: Diagnosis not present

## 2016-11-09 DIAGNOSIS — I2510 Atherosclerotic heart disease of native coronary artery without angina pectoris: Secondary | ICD-10-CM | POA: Diagnosis not present

## 2016-11-09 DIAGNOSIS — E1151 Type 2 diabetes mellitus with diabetic peripheral angiopathy without gangrene: Secondary | ICD-10-CM | POA: Diagnosis not present

## 2016-11-09 DIAGNOSIS — I10 Essential (primary) hypertension: Secondary | ICD-10-CM | POA: Diagnosis not present

## 2016-11-12 DIAGNOSIS — R270 Ataxia, unspecified: Secondary | ICD-10-CM | POA: Diagnosis not present

## 2016-11-12 DIAGNOSIS — E1151 Type 2 diabetes mellitus with diabetic peripheral angiopathy without gangrene: Secondary | ICD-10-CM | POA: Diagnosis not present

## 2016-11-12 DIAGNOSIS — M47812 Spondylosis without myelopathy or radiculopathy, cervical region: Secondary | ICD-10-CM | POA: Diagnosis not present

## 2016-11-12 DIAGNOSIS — I2510 Atherosclerotic heart disease of native coronary artery without angina pectoris: Secondary | ICD-10-CM | POA: Diagnosis not present

## 2016-11-12 DIAGNOSIS — I10 Essential (primary) hypertension: Secondary | ICD-10-CM | POA: Diagnosis not present

## 2016-11-12 DIAGNOSIS — J449 Chronic obstructive pulmonary disease, unspecified: Secondary | ICD-10-CM | POA: Diagnosis not present

## 2016-11-14 DIAGNOSIS — E782 Mixed hyperlipidemia: Secondary | ICD-10-CM | POA: Diagnosis not present

## 2016-11-14 DIAGNOSIS — I2510 Atherosclerotic heart disease of native coronary artery without angina pectoris: Secondary | ICD-10-CM | POA: Diagnosis not present

## 2016-11-20 DIAGNOSIS — R270 Ataxia, unspecified: Secondary | ICD-10-CM | POA: Diagnosis not present

## 2016-11-20 DIAGNOSIS — J449 Chronic obstructive pulmonary disease, unspecified: Secondary | ICD-10-CM | POA: Diagnosis not present

## 2016-11-20 DIAGNOSIS — I2510 Atherosclerotic heart disease of native coronary artery without angina pectoris: Secondary | ICD-10-CM | POA: Diagnosis not present

## 2016-11-20 DIAGNOSIS — E1151 Type 2 diabetes mellitus with diabetic peripheral angiopathy without gangrene: Secondary | ICD-10-CM | POA: Diagnosis not present

## 2016-11-20 DIAGNOSIS — M47812 Spondylosis without myelopathy or radiculopathy, cervical region: Secondary | ICD-10-CM | POA: Diagnosis not present

## 2016-11-20 DIAGNOSIS — I10 Essential (primary) hypertension: Secondary | ICD-10-CM | POA: Diagnosis not present

## 2016-12-03 DIAGNOSIS — E1151 Type 2 diabetes mellitus with diabetic peripheral angiopathy without gangrene: Secondary | ICD-10-CM | POA: Diagnosis not present

## 2016-12-03 DIAGNOSIS — J449 Chronic obstructive pulmonary disease, unspecified: Secondary | ICD-10-CM | POA: Diagnosis not present

## 2016-12-03 DIAGNOSIS — I10 Essential (primary) hypertension: Secondary | ICD-10-CM | POA: Diagnosis not present

## 2016-12-03 DIAGNOSIS — R270 Ataxia, unspecified: Secondary | ICD-10-CM | POA: Diagnosis not present

## 2016-12-03 DIAGNOSIS — I2510 Atherosclerotic heart disease of native coronary artery without angina pectoris: Secondary | ICD-10-CM | POA: Diagnosis not present

## 2016-12-03 DIAGNOSIS — M47812 Spondylosis without myelopathy or radiculopathy, cervical region: Secondary | ICD-10-CM | POA: Diagnosis not present

## 2017-01-01 DIAGNOSIS — D510 Vitamin B12 deficiency anemia due to intrinsic factor deficiency: Secondary | ICD-10-CM | POA: Diagnosis not present

## 2017-01-01 DIAGNOSIS — E7800 Pure hypercholesterolemia, unspecified: Secondary | ICD-10-CM | POA: Diagnosis not present

## 2017-01-01 DIAGNOSIS — E1165 Type 2 diabetes mellitus with hyperglycemia: Secondary | ICD-10-CM | POA: Diagnosis not present

## 2017-01-01 DIAGNOSIS — R270 Ataxia, unspecified: Secondary | ICD-10-CM | POA: Diagnosis not present

## 2017-01-01 DIAGNOSIS — Z23 Encounter for immunization: Secondary | ICD-10-CM | POA: Diagnosis not present

## 2017-01-01 DIAGNOSIS — Z79899 Other long term (current) drug therapy: Secondary | ICD-10-CM | POA: Diagnosis not present

## 2017-01-01 DIAGNOSIS — E039 Hypothyroidism, unspecified: Secondary | ICD-10-CM | POA: Diagnosis not present

## 2017-01-01 DIAGNOSIS — E785 Hyperlipidemia, unspecified: Secondary | ICD-10-CM | POA: Diagnosis not present

## 2017-01-01 DIAGNOSIS — I2510 Atherosclerotic heart disease of native coronary artery without angina pectoris: Secondary | ICD-10-CM | POA: Diagnosis not present

## 2017-01-05 DIAGNOSIS — F0391 Unspecified dementia with behavioral disturbance: Secondary | ICD-10-CM | POA: Diagnosis not present

## 2017-01-05 DIAGNOSIS — I2510 Atherosclerotic heart disease of native coronary artery without angina pectoris: Secondary | ICD-10-CM | POA: Diagnosis not present

## 2017-01-05 DIAGNOSIS — E1165 Type 2 diabetes mellitus with hyperglycemia: Secondary | ICD-10-CM | POA: Diagnosis not present

## 2017-01-05 DIAGNOSIS — E785 Hyperlipidemia, unspecified: Secondary | ICD-10-CM | POA: Diagnosis not present

## 2017-01-05 DIAGNOSIS — D510 Vitamin B12 deficiency anemia due to intrinsic factor deficiency: Secondary | ICD-10-CM | POA: Diagnosis not present

## 2017-01-05 DIAGNOSIS — R260 Ataxic gait: Secondary | ICD-10-CM | POA: Diagnosis not present

## 2017-01-06 DIAGNOSIS — E785 Hyperlipidemia, unspecified: Secondary | ICD-10-CM | POA: Diagnosis not present

## 2017-01-06 DIAGNOSIS — F0391 Unspecified dementia with behavioral disturbance: Secondary | ICD-10-CM | POA: Diagnosis not present

## 2017-01-06 DIAGNOSIS — R260 Ataxic gait: Secondary | ICD-10-CM | POA: Diagnosis not present

## 2017-01-06 DIAGNOSIS — I2510 Atherosclerotic heart disease of native coronary artery without angina pectoris: Secondary | ICD-10-CM | POA: Diagnosis not present

## 2017-01-06 DIAGNOSIS — E1165 Type 2 diabetes mellitus with hyperglycemia: Secondary | ICD-10-CM | POA: Diagnosis not present

## 2017-01-06 DIAGNOSIS — D510 Vitamin B12 deficiency anemia due to intrinsic factor deficiency: Secondary | ICD-10-CM | POA: Diagnosis not present

## 2017-01-08 DIAGNOSIS — D510 Vitamin B12 deficiency anemia due to intrinsic factor deficiency: Secondary | ICD-10-CM | POA: Diagnosis not present

## 2017-01-08 DIAGNOSIS — F0391 Unspecified dementia with behavioral disturbance: Secondary | ICD-10-CM | POA: Diagnosis not present

## 2017-01-08 DIAGNOSIS — E1165 Type 2 diabetes mellitus with hyperglycemia: Secondary | ICD-10-CM | POA: Diagnosis not present

## 2017-01-08 DIAGNOSIS — I10 Essential (primary) hypertension: Secondary | ICD-10-CM | POA: Diagnosis not present

## 2017-01-08 DIAGNOSIS — E1141 Type 2 diabetes mellitus with diabetic mononeuropathy: Secondary | ICD-10-CM | POA: Diagnosis not present

## 2017-01-08 DIAGNOSIS — E785 Hyperlipidemia, unspecified: Secondary | ICD-10-CM | POA: Diagnosis not present

## 2017-01-08 DIAGNOSIS — E669 Obesity, unspecified: Secondary | ICD-10-CM | POA: Diagnosis not present

## 2017-01-08 DIAGNOSIS — R260 Ataxic gait: Secondary | ICD-10-CM | POA: Diagnosis not present

## 2017-01-08 DIAGNOSIS — I2510 Atherosclerotic heart disease of native coronary artery without angina pectoris: Secondary | ICD-10-CM | POA: Diagnosis not present

## 2017-01-10 DIAGNOSIS — D510 Vitamin B12 deficiency anemia due to intrinsic factor deficiency: Secondary | ICD-10-CM | POA: Diagnosis not present

## 2017-01-10 DIAGNOSIS — F0391 Unspecified dementia with behavioral disturbance: Secondary | ICD-10-CM | POA: Diagnosis not present

## 2017-01-10 DIAGNOSIS — E785 Hyperlipidemia, unspecified: Secondary | ICD-10-CM | POA: Diagnosis not present

## 2017-01-10 DIAGNOSIS — I2510 Atherosclerotic heart disease of native coronary artery without angina pectoris: Secondary | ICD-10-CM | POA: Diagnosis not present

## 2017-01-10 DIAGNOSIS — R260 Ataxic gait: Secondary | ICD-10-CM | POA: Diagnosis not present

## 2017-01-10 DIAGNOSIS — E1165 Type 2 diabetes mellitus with hyperglycemia: Secondary | ICD-10-CM | POA: Diagnosis not present

## 2017-01-13 DIAGNOSIS — R260 Ataxic gait: Secondary | ICD-10-CM | POA: Diagnosis not present

## 2017-01-13 DIAGNOSIS — E1165 Type 2 diabetes mellitus with hyperglycemia: Secondary | ICD-10-CM | POA: Diagnosis not present

## 2017-01-13 DIAGNOSIS — D510 Vitamin B12 deficiency anemia due to intrinsic factor deficiency: Secondary | ICD-10-CM | POA: Diagnosis not present

## 2017-01-13 DIAGNOSIS — I2510 Atherosclerotic heart disease of native coronary artery without angina pectoris: Secondary | ICD-10-CM | POA: Diagnosis not present

## 2017-01-13 DIAGNOSIS — E785 Hyperlipidemia, unspecified: Secondary | ICD-10-CM | POA: Diagnosis not present

## 2017-01-13 DIAGNOSIS — F0391 Unspecified dementia with behavioral disturbance: Secondary | ICD-10-CM | POA: Diagnosis not present

## 2017-01-14 DIAGNOSIS — I2510 Atherosclerotic heart disease of native coronary artery without angina pectoris: Secondary | ICD-10-CM | POA: Diagnosis not present

## 2017-01-14 DIAGNOSIS — E1165 Type 2 diabetes mellitus with hyperglycemia: Secondary | ICD-10-CM | POA: Diagnosis not present

## 2017-01-14 DIAGNOSIS — F0391 Unspecified dementia with behavioral disturbance: Secondary | ICD-10-CM | POA: Diagnosis not present

## 2017-01-14 DIAGNOSIS — D510 Vitamin B12 deficiency anemia due to intrinsic factor deficiency: Secondary | ICD-10-CM | POA: Diagnosis not present

## 2017-01-14 DIAGNOSIS — E785 Hyperlipidemia, unspecified: Secondary | ICD-10-CM | POA: Diagnosis not present

## 2017-01-14 DIAGNOSIS — R260 Ataxic gait: Secondary | ICD-10-CM | POA: Diagnosis not present

## 2017-01-15 DIAGNOSIS — E1165 Type 2 diabetes mellitus with hyperglycemia: Secondary | ICD-10-CM | POA: Diagnosis not present

## 2017-01-15 DIAGNOSIS — I2510 Atherosclerotic heart disease of native coronary artery without angina pectoris: Secondary | ICD-10-CM | POA: Diagnosis not present

## 2017-01-15 DIAGNOSIS — R260 Ataxic gait: Secondary | ICD-10-CM | POA: Diagnosis not present

## 2017-01-15 DIAGNOSIS — F0391 Unspecified dementia with behavioral disturbance: Secondary | ICD-10-CM | POA: Diagnosis not present

## 2017-01-15 DIAGNOSIS — D510 Vitamin B12 deficiency anemia due to intrinsic factor deficiency: Secondary | ICD-10-CM | POA: Diagnosis not present

## 2017-01-15 DIAGNOSIS — E785 Hyperlipidemia, unspecified: Secondary | ICD-10-CM | POA: Diagnosis not present

## 2017-01-16 DIAGNOSIS — D510 Vitamin B12 deficiency anemia due to intrinsic factor deficiency: Secondary | ICD-10-CM | POA: Diagnosis not present

## 2017-01-16 DIAGNOSIS — E785 Hyperlipidemia, unspecified: Secondary | ICD-10-CM | POA: Diagnosis not present

## 2017-01-16 DIAGNOSIS — E1165 Type 2 diabetes mellitus with hyperglycemia: Secondary | ICD-10-CM | POA: Diagnosis not present

## 2017-01-16 DIAGNOSIS — I2510 Atherosclerotic heart disease of native coronary artery without angina pectoris: Secondary | ICD-10-CM | POA: Diagnosis not present

## 2017-01-16 DIAGNOSIS — R260 Ataxic gait: Secondary | ICD-10-CM | POA: Diagnosis not present

## 2017-01-16 DIAGNOSIS — F0391 Unspecified dementia with behavioral disturbance: Secondary | ICD-10-CM | POA: Diagnosis not present

## 2017-01-17 DIAGNOSIS — R260 Ataxic gait: Secondary | ICD-10-CM | POA: Diagnosis not present

## 2017-01-17 DIAGNOSIS — E1165 Type 2 diabetes mellitus with hyperglycemia: Secondary | ICD-10-CM | POA: Diagnosis not present

## 2017-01-17 DIAGNOSIS — D510 Vitamin B12 deficiency anemia due to intrinsic factor deficiency: Secondary | ICD-10-CM | POA: Diagnosis not present

## 2017-01-17 DIAGNOSIS — F0391 Unspecified dementia with behavioral disturbance: Secondary | ICD-10-CM | POA: Diagnosis not present

## 2017-01-17 DIAGNOSIS — I2510 Atherosclerotic heart disease of native coronary artery without angina pectoris: Secondary | ICD-10-CM | POA: Diagnosis not present

## 2017-01-17 DIAGNOSIS — E785 Hyperlipidemia, unspecified: Secondary | ICD-10-CM | POA: Diagnosis not present

## 2017-01-18 DIAGNOSIS — E1165 Type 2 diabetes mellitus with hyperglycemia: Secondary | ICD-10-CM | POA: Diagnosis not present

## 2017-01-18 DIAGNOSIS — D510 Vitamin B12 deficiency anemia due to intrinsic factor deficiency: Secondary | ICD-10-CM | POA: Diagnosis not present

## 2017-01-18 DIAGNOSIS — E785 Hyperlipidemia, unspecified: Secondary | ICD-10-CM | POA: Diagnosis not present

## 2017-01-18 DIAGNOSIS — I2510 Atherosclerotic heart disease of native coronary artery without angina pectoris: Secondary | ICD-10-CM | POA: Diagnosis not present

## 2017-01-18 DIAGNOSIS — F0391 Unspecified dementia with behavioral disturbance: Secondary | ICD-10-CM | POA: Diagnosis not present

## 2017-01-18 DIAGNOSIS — R260 Ataxic gait: Secondary | ICD-10-CM | POA: Diagnosis not present

## 2017-01-20 DIAGNOSIS — F0391 Unspecified dementia with behavioral disturbance: Secondary | ICD-10-CM | POA: Diagnosis not present

## 2017-01-20 DIAGNOSIS — E785 Hyperlipidemia, unspecified: Secondary | ICD-10-CM | POA: Diagnosis not present

## 2017-01-20 DIAGNOSIS — R260 Ataxic gait: Secondary | ICD-10-CM | POA: Diagnosis not present

## 2017-01-20 DIAGNOSIS — D510 Vitamin B12 deficiency anemia due to intrinsic factor deficiency: Secondary | ICD-10-CM | POA: Diagnosis not present

## 2017-01-20 DIAGNOSIS — E1165 Type 2 diabetes mellitus with hyperglycemia: Secondary | ICD-10-CM | POA: Diagnosis not present

## 2017-01-20 DIAGNOSIS — I2510 Atherosclerotic heart disease of native coronary artery without angina pectoris: Secondary | ICD-10-CM | POA: Diagnosis not present

## 2017-01-21 DIAGNOSIS — F0391 Unspecified dementia with behavioral disturbance: Secondary | ICD-10-CM | POA: Diagnosis not present

## 2017-01-21 DIAGNOSIS — R260 Ataxic gait: Secondary | ICD-10-CM | POA: Diagnosis not present

## 2017-01-21 DIAGNOSIS — E1165 Type 2 diabetes mellitus with hyperglycemia: Secondary | ICD-10-CM | POA: Diagnosis not present

## 2017-01-21 DIAGNOSIS — I2510 Atherosclerotic heart disease of native coronary artery without angina pectoris: Secondary | ICD-10-CM | POA: Diagnosis not present

## 2017-01-21 DIAGNOSIS — E785 Hyperlipidemia, unspecified: Secondary | ICD-10-CM | POA: Diagnosis not present

## 2017-01-21 DIAGNOSIS — D510 Vitamin B12 deficiency anemia due to intrinsic factor deficiency: Secondary | ICD-10-CM | POA: Diagnosis not present

## 2017-01-22 DIAGNOSIS — E785 Hyperlipidemia, unspecified: Secondary | ICD-10-CM | POA: Diagnosis not present

## 2017-01-22 DIAGNOSIS — E1165 Type 2 diabetes mellitus with hyperglycemia: Secondary | ICD-10-CM | POA: Diagnosis not present

## 2017-01-22 DIAGNOSIS — R260 Ataxic gait: Secondary | ICD-10-CM | POA: Diagnosis not present

## 2017-01-22 DIAGNOSIS — I2510 Atherosclerotic heart disease of native coronary artery without angina pectoris: Secondary | ICD-10-CM | POA: Diagnosis not present

## 2017-01-22 DIAGNOSIS — F0391 Unspecified dementia with behavioral disturbance: Secondary | ICD-10-CM | POA: Diagnosis not present

## 2017-01-22 DIAGNOSIS — D510 Vitamin B12 deficiency anemia due to intrinsic factor deficiency: Secondary | ICD-10-CM | POA: Diagnosis not present

## 2017-01-24 DIAGNOSIS — F0391 Unspecified dementia with behavioral disturbance: Secondary | ICD-10-CM | POA: Diagnosis not present

## 2017-01-24 DIAGNOSIS — D510 Vitamin B12 deficiency anemia due to intrinsic factor deficiency: Secondary | ICD-10-CM | POA: Diagnosis not present

## 2017-01-24 DIAGNOSIS — R260 Ataxic gait: Secondary | ICD-10-CM | POA: Diagnosis not present

## 2017-01-24 DIAGNOSIS — E1165 Type 2 diabetes mellitus with hyperglycemia: Secondary | ICD-10-CM | POA: Diagnosis not present

## 2017-01-24 DIAGNOSIS — E785 Hyperlipidemia, unspecified: Secondary | ICD-10-CM | POA: Diagnosis not present

## 2017-01-24 DIAGNOSIS — I2510 Atherosclerotic heart disease of native coronary artery without angina pectoris: Secondary | ICD-10-CM | POA: Diagnosis not present

## 2017-01-30 DIAGNOSIS — I2510 Atherosclerotic heart disease of native coronary artery without angina pectoris: Secondary | ICD-10-CM | POA: Diagnosis not present

## 2017-01-30 DIAGNOSIS — R260 Ataxic gait: Secondary | ICD-10-CM | POA: Diagnosis not present

## 2017-01-30 DIAGNOSIS — D510 Vitamin B12 deficiency anemia due to intrinsic factor deficiency: Secondary | ICD-10-CM | POA: Diagnosis not present

## 2017-01-30 DIAGNOSIS — E785 Hyperlipidemia, unspecified: Secondary | ICD-10-CM | POA: Diagnosis not present

## 2017-01-30 DIAGNOSIS — E1165 Type 2 diabetes mellitus with hyperglycemia: Secondary | ICD-10-CM | POA: Diagnosis not present

## 2017-01-30 DIAGNOSIS — F0391 Unspecified dementia with behavioral disturbance: Secondary | ICD-10-CM | POA: Diagnosis not present

## 2017-01-31 DIAGNOSIS — E1165 Type 2 diabetes mellitus with hyperglycemia: Secondary | ICD-10-CM | POA: Diagnosis not present

## 2017-01-31 DIAGNOSIS — D510 Vitamin B12 deficiency anemia due to intrinsic factor deficiency: Secondary | ICD-10-CM | POA: Diagnosis not present

## 2017-01-31 DIAGNOSIS — I2510 Atherosclerotic heart disease of native coronary artery without angina pectoris: Secondary | ICD-10-CM | POA: Diagnosis not present

## 2017-01-31 DIAGNOSIS — F0391 Unspecified dementia with behavioral disturbance: Secondary | ICD-10-CM | POA: Diagnosis not present

## 2017-01-31 DIAGNOSIS — R260 Ataxic gait: Secondary | ICD-10-CM | POA: Diagnosis not present

## 2017-01-31 DIAGNOSIS — E785 Hyperlipidemia, unspecified: Secondary | ICD-10-CM | POA: Diagnosis not present

## 2017-02-03 DIAGNOSIS — I2510 Atherosclerotic heart disease of native coronary artery without angina pectoris: Secondary | ICD-10-CM | POA: Diagnosis not present

## 2017-02-03 DIAGNOSIS — D510 Vitamin B12 deficiency anemia due to intrinsic factor deficiency: Secondary | ICD-10-CM | POA: Diagnosis not present

## 2017-02-03 DIAGNOSIS — R260 Ataxic gait: Secondary | ICD-10-CM | POA: Diagnosis not present

## 2017-02-03 DIAGNOSIS — E785 Hyperlipidemia, unspecified: Secondary | ICD-10-CM | POA: Diagnosis not present

## 2017-02-03 DIAGNOSIS — F0391 Unspecified dementia with behavioral disturbance: Secondary | ICD-10-CM | POA: Diagnosis not present

## 2017-02-03 DIAGNOSIS — E1165 Type 2 diabetes mellitus with hyperglycemia: Secondary | ICD-10-CM | POA: Diagnosis not present

## 2017-02-05 DIAGNOSIS — R260 Ataxic gait: Secondary | ICD-10-CM | POA: Diagnosis not present

## 2017-02-05 DIAGNOSIS — D510 Vitamin B12 deficiency anemia due to intrinsic factor deficiency: Secondary | ICD-10-CM | POA: Diagnosis not present

## 2017-02-05 DIAGNOSIS — F0391 Unspecified dementia with behavioral disturbance: Secondary | ICD-10-CM | POA: Diagnosis not present

## 2017-02-05 DIAGNOSIS — I2510 Atherosclerotic heart disease of native coronary artery without angina pectoris: Secondary | ICD-10-CM | POA: Diagnosis not present

## 2017-02-05 DIAGNOSIS — E1165 Type 2 diabetes mellitus with hyperglycemia: Secondary | ICD-10-CM | POA: Diagnosis not present

## 2017-02-05 DIAGNOSIS — E1141 Type 2 diabetes mellitus with diabetic mononeuropathy: Secondary | ICD-10-CM | POA: Diagnosis not present

## 2017-02-05 DIAGNOSIS — E038 Other specified hypothyroidism: Secondary | ICD-10-CM | POA: Diagnosis not present

## 2017-02-05 DIAGNOSIS — E785 Hyperlipidemia, unspecified: Secondary | ICD-10-CM | POA: Diagnosis not present

## 2017-02-05 DIAGNOSIS — R270 Ataxia, unspecified: Secondary | ICD-10-CM | POA: Diagnosis not present

## 2017-02-06 DIAGNOSIS — E785 Hyperlipidemia, unspecified: Secondary | ICD-10-CM | POA: Diagnosis not present

## 2017-02-06 DIAGNOSIS — R260 Ataxic gait: Secondary | ICD-10-CM | POA: Diagnosis not present

## 2017-02-06 DIAGNOSIS — D510 Vitamin B12 deficiency anemia due to intrinsic factor deficiency: Secondary | ICD-10-CM | POA: Diagnosis not present

## 2017-02-06 DIAGNOSIS — E1165 Type 2 diabetes mellitus with hyperglycemia: Secondary | ICD-10-CM | POA: Diagnosis not present

## 2017-02-06 DIAGNOSIS — F0391 Unspecified dementia with behavioral disturbance: Secondary | ICD-10-CM | POA: Diagnosis not present

## 2017-02-06 DIAGNOSIS — I2510 Atherosclerotic heart disease of native coronary artery without angina pectoris: Secondary | ICD-10-CM | POA: Diagnosis not present

## 2017-02-07 DIAGNOSIS — D510 Vitamin B12 deficiency anemia due to intrinsic factor deficiency: Secondary | ICD-10-CM | POA: Diagnosis not present

## 2017-02-07 DIAGNOSIS — F0391 Unspecified dementia with behavioral disturbance: Secondary | ICD-10-CM | POA: Diagnosis not present

## 2017-02-07 DIAGNOSIS — E1165 Type 2 diabetes mellitus with hyperglycemia: Secondary | ICD-10-CM | POA: Diagnosis not present

## 2017-02-07 DIAGNOSIS — I2510 Atherosclerotic heart disease of native coronary artery without angina pectoris: Secondary | ICD-10-CM | POA: Diagnosis not present

## 2017-02-07 DIAGNOSIS — R260 Ataxic gait: Secondary | ICD-10-CM | POA: Diagnosis not present

## 2017-02-07 DIAGNOSIS — E785 Hyperlipidemia, unspecified: Secondary | ICD-10-CM | POA: Diagnosis not present

## 2017-02-13 DIAGNOSIS — R260 Ataxic gait: Secondary | ICD-10-CM | POA: Diagnosis not present

## 2017-02-13 DIAGNOSIS — I2510 Atherosclerotic heart disease of native coronary artery without angina pectoris: Secondary | ICD-10-CM | POA: Diagnosis not present

## 2017-02-13 DIAGNOSIS — E785 Hyperlipidemia, unspecified: Secondary | ICD-10-CM | POA: Diagnosis not present

## 2017-02-13 DIAGNOSIS — D510 Vitamin B12 deficiency anemia due to intrinsic factor deficiency: Secondary | ICD-10-CM | POA: Diagnosis not present

## 2017-02-13 DIAGNOSIS — F0391 Unspecified dementia with behavioral disturbance: Secondary | ICD-10-CM | POA: Diagnosis not present

## 2017-02-13 DIAGNOSIS — E1165 Type 2 diabetes mellitus with hyperglycemia: Secondary | ICD-10-CM | POA: Diagnosis not present

## 2017-02-20 DIAGNOSIS — D510 Vitamin B12 deficiency anemia due to intrinsic factor deficiency: Secondary | ICD-10-CM | POA: Diagnosis not present

## 2017-02-20 DIAGNOSIS — E785 Hyperlipidemia, unspecified: Secondary | ICD-10-CM | POA: Diagnosis not present

## 2017-02-20 DIAGNOSIS — R260 Ataxic gait: Secondary | ICD-10-CM | POA: Diagnosis not present

## 2017-02-20 DIAGNOSIS — I2510 Atherosclerotic heart disease of native coronary artery without angina pectoris: Secondary | ICD-10-CM | POA: Diagnosis not present

## 2017-02-20 DIAGNOSIS — F0391 Unspecified dementia with behavioral disturbance: Secondary | ICD-10-CM | POA: Diagnosis not present

## 2017-02-20 DIAGNOSIS — E1165 Type 2 diabetes mellitus with hyperglycemia: Secondary | ICD-10-CM | POA: Diagnosis not present

## 2017-02-27 DIAGNOSIS — F0391 Unspecified dementia with behavioral disturbance: Secondary | ICD-10-CM | POA: Diagnosis not present

## 2017-02-27 DIAGNOSIS — E1165 Type 2 diabetes mellitus with hyperglycemia: Secondary | ICD-10-CM | POA: Diagnosis not present

## 2017-02-27 DIAGNOSIS — I2510 Atherosclerotic heart disease of native coronary artery without angina pectoris: Secondary | ICD-10-CM | POA: Diagnosis not present

## 2017-02-27 DIAGNOSIS — E785 Hyperlipidemia, unspecified: Secondary | ICD-10-CM | POA: Diagnosis not present

## 2017-02-27 DIAGNOSIS — R260 Ataxic gait: Secondary | ICD-10-CM | POA: Diagnosis not present

## 2017-02-27 DIAGNOSIS — D510 Vitamin B12 deficiency anemia due to intrinsic factor deficiency: Secondary | ICD-10-CM | POA: Diagnosis not present

## 2017-03-04 DIAGNOSIS — F0391 Unspecified dementia with behavioral disturbance: Secondary | ICD-10-CM | POA: Diagnosis not present

## 2017-03-04 DIAGNOSIS — E1165 Type 2 diabetes mellitus with hyperglycemia: Secondary | ICD-10-CM | POA: Diagnosis not present

## 2017-03-04 DIAGNOSIS — I2510 Atherosclerotic heart disease of native coronary artery without angina pectoris: Secondary | ICD-10-CM | POA: Diagnosis not present

## 2017-03-04 DIAGNOSIS — R260 Ataxic gait: Secondary | ICD-10-CM | POA: Diagnosis not present

## 2017-03-04 DIAGNOSIS — D510 Vitamin B12 deficiency anemia due to intrinsic factor deficiency: Secondary | ICD-10-CM | POA: Diagnosis not present

## 2017-03-04 DIAGNOSIS — E785 Hyperlipidemia, unspecified: Secondary | ICD-10-CM | POA: Diagnosis not present

## 2017-03-06 DIAGNOSIS — D510 Vitamin B12 deficiency anemia due to intrinsic factor deficiency: Secondary | ICD-10-CM | POA: Diagnosis not present

## 2017-03-06 DIAGNOSIS — E785 Hyperlipidemia, unspecified: Secondary | ICD-10-CM | POA: Diagnosis not present

## 2017-03-06 DIAGNOSIS — M545 Low back pain: Secondary | ICD-10-CM | POA: Diagnosis not present

## 2017-03-06 DIAGNOSIS — I2510 Atherosclerotic heart disease of native coronary artery without angina pectoris: Secondary | ICD-10-CM | POA: Diagnosis not present

## 2017-03-06 DIAGNOSIS — E119 Type 2 diabetes mellitus without complications: Secondary | ICD-10-CM | POA: Diagnosis not present

## 2017-03-06 DIAGNOSIS — F0391 Unspecified dementia with behavioral disturbance: Secondary | ICD-10-CM | POA: Diagnosis not present

## 2017-03-13 DIAGNOSIS — E119 Type 2 diabetes mellitus without complications: Secondary | ICD-10-CM | POA: Diagnosis not present

## 2017-03-13 DIAGNOSIS — M545 Low back pain: Secondary | ICD-10-CM | POA: Diagnosis not present

## 2017-03-13 DIAGNOSIS — D510 Vitamin B12 deficiency anemia due to intrinsic factor deficiency: Secondary | ICD-10-CM | POA: Diagnosis not present

## 2017-03-13 DIAGNOSIS — E785 Hyperlipidemia, unspecified: Secondary | ICD-10-CM | POA: Diagnosis not present

## 2017-03-13 DIAGNOSIS — I2510 Atherosclerotic heart disease of native coronary artery without angina pectoris: Secondary | ICD-10-CM | POA: Diagnosis not present

## 2017-03-13 DIAGNOSIS — F0391 Unspecified dementia with behavioral disturbance: Secondary | ICD-10-CM | POA: Diagnosis not present

## 2017-03-14 DIAGNOSIS — E785 Hyperlipidemia, unspecified: Secondary | ICD-10-CM | POA: Diagnosis not present

## 2017-03-14 DIAGNOSIS — F0391 Unspecified dementia with behavioral disturbance: Secondary | ICD-10-CM | POA: Diagnosis not present

## 2017-03-14 DIAGNOSIS — M545 Low back pain: Secondary | ICD-10-CM | POA: Diagnosis not present

## 2017-03-14 DIAGNOSIS — I2510 Atherosclerotic heart disease of native coronary artery without angina pectoris: Secondary | ICD-10-CM | POA: Diagnosis not present

## 2017-03-14 DIAGNOSIS — E119 Type 2 diabetes mellitus without complications: Secondary | ICD-10-CM | POA: Diagnosis not present

## 2017-03-14 DIAGNOSIS — D510 Vitamin B12 deficiency anemia due to intrinsic factor deficiency: Secondary | ICD-10-CM | POA: Diagnosis not present

## 2017-03-17 DIAGNOSIS — F0391 Unspecified dementia with behavioral disturbance: Secondary | ICD-10-CM | POA: Diagnosis not present

## 2017-03-17 DIAGNOSIS — M545 Low back pain: Secondary | ICD-10-CM | POA: Diagnosis not present

## 2017-03-17 DIAGNOSIS — E785 Hyperlipidemia, unspecified: Secondary | ICD-10-CM | POA: Diagnosis not present

## 2017-03-17 DIAGNOSIS — D510 Vitamin B12 deficiency anemia due to intrinsic factor deficiency: Secondary | ICD-10-CM | POA: Diagnosis not present

## 2017-03-17 DIAGNOSIS — E119 Type 2 diabetes mellitus without complications: Secondary | ICD-10-CM | POA: Diagnosis not present

## 2017-03-17 DIAGNOSIS — I2510 Atherosclerotic heart disease of native coronary artery without angina pectoris: Secondary | ICD-10-CM | POA: Diagnosis not present

## 2017-03-21 DIAGNOSIS — I2510 Atherosclerotic heart disease of native coronary artery without angina pectoris: Secondary | ICD-10-CM | POA: Diagnosis not present

## 2017-03-21 DIAGNOSIS — D510 Vitamin B12 deficiency anemia due to intrinsic factor deficiency: Secondary | ICD-10-CM | POA: Diagnosis not present

## 2017-03-21 DIAGNOSIS — F0391 Unspecified dementia with behavioral disturbance: Secondary | ICD-10-CM | POA: Diagnosis not present

## 2017-03-21 DIAGNOSIS — E785 Hyperlipidemia, unspecified: Secondary | ICD-10-CM | POA: Diagnosis not present

## 2017-03-21 DIAGNOSIS — M545 Low back pain: Secondary | ICD-10-CM | POA: Diagnosis not present

## 2017-03-21 DIAGNOSIS — E119 Type 2 diabetes mellitus without complications: Secondary | ICD-10-CM | POA: Diagnosis not present

## 2017-03-28 DIAGNOSIS — I2510 Atherosclerotic heart disease of native coronary artery without angina pectoris: Secondary | ICD-10-CM | POA: Diagnosis not present

## 2017-03-28 DIAGNOSIS — D510 Vitamin B12 deficiency anemia due to intrinsic factor deficiency: Secondary | ICD-10-CM | POA: Diagnosis not present

## 2017-03-28 DIAGNOSIS — M545 Low back pain: Secondary | ICD-10-CM | POA: Diagnosis not present

## 2017-03-28 DIAGNOSIS — F0391 Unspecified dementia with behavioral disturbance: Secondary | ICD-10-CM | POA: Diagnosis not present

## 2017-03-28 DIAGNOSIS — E119 Type 2 diabetes mellitus without complications: Secondary | ICD-10-CM | POA: Diagnosis not present

## 2017-03-28 DIAGNOSIS — E785 Hyperlipidemia, unspecified: Secondary | ICD-10-CM | POA: Diagnosis not present

## 2017-04-11 DIAGNOSIS — M545 Low back pain: Secondary | ICD-10-CM | POA: Diagnosis not present

## 2017-04-11 DIAGNOSIS — D510 Vitamin B12 deficiency anemia due to intrinsic factor deficiency: Secondary | ICD-10-CM | POA: Diagnosis not present

## 2017-04-11 DIAGNOSIS — F0391 Unspecified dementia with behavioral disturbance: Secondary | ICD-10-CM | POA: Diagnosis not present

## 2017-04-11 DIAGNOSIS — E119 Type 2 diabetes mellitus without complications: Secondary | ICD-10-CM | POA: Diagnosis not present

## 2017-04-11 DIAGNOSIS — I2510 Atherosclerotic heart disease of native coronary artery without angina pectoris: Secondary | ICD-10-CM | POA: Diagnosis not present

## 2017-04-11 DIAGNOSIS — E785 Hyperlipidemia, unspecified: Secondary | ICD-10-CM | POA: Diagnosis not present

## 2017-04-18 DIAGNOSIS — D510 Vitamin B12 deficiency anemia due to intrinsic factor deficiency: Secondary | ICD-10-CM | POA: Diagnosis not present

## 2017-04-18 DIAGNOSIS — E119 Type 2 diabetes mellitus without complications: Secondary | ICD-10-CM | POA: Diagnosis not present

## 2017-04-18 DIAGNOSIS — M545 Low back pain: Secondary | ICD-10-CM | POA: Diagnosis not present

## 2017-04-18 DIAGNOSIS — I2510 Atherosclerotic heart disease of native coronary artery without angina pectoris: Secondary | ICD-10-CM | POA: Diagnosis not present

## 2017-04-18 DIAGNOSIS — F0391 Unspecified dementia with behavioral disturbance: Secondary | ICD-10-CM | POA: Diagnosis not present

## 2017-04-18 DIAGNOSIS — E785 Hyperlipidemia, unspecified: Secondary | ICD-10-CM | POA: Diagnosis not present

## 2017-05-12 DIAGNOSIS — E038 Other specified hypothyroidism: Secondary | ICD-10-CM | POA: Diagnosis not present

## 2017-05-12 DIAGNOSIS — E039 Hypothyroidism, unspecified: Secondary | ICD-10-CM | POA: Diagnosis not present

## 2017-05-12 DIAGNOSIS — Z6838 Body mass index (BMI) 38.0-38.9, adult: Secondary | ICD-10-CM | POA: Diagnosis not present

## 2017-05-12 DIAGNOSIS — R270 Ataxia, unspecified: Secondary | ICD-10-CM | POA: Diagnosis not present

## 2017-05-12 DIAGNOSIS — E118 Type 2 diabetes mellitus with unspecified complications: Secondary | ICD-10-CM | POA: Diagnosis not present

## 2017-05-12 DIAGNOSIS — I10 Essential (primary) hypertension: Secondary | ICD-10-CM | POA: Diagnosis not present

## 2017-05-12 DIAGNOSIS — N3945 Continuous leakage: Secondary | ICD-10-CM | POA: Diagnosis not present

## 2017-05-12 DIAGNOSIS — E7800 Pure hypercholesterolemia, unspecified: Secondary | ICD-10-CM | POA: Diagnosis not present

## 2017-05-12 DIAGNOSIS — E1141 Type 2 diabetes mellitus with diabetic mononeuropathy: Secondary | ICD-10-CM | POA: Diagnosis not present

## 2017-05-12 DIAGNOSIS — E1165 Type 2 diabetes mellitus with hyperglycemia: Secondary | ICD-10-CM | POA: Diagnosis not present

## 2017-05-13 DIAGNOSIS — M25552 Pain in left hip: Secondary | ICD-10-CM | POA: Diagnosis not present

## 2017-05-13 DIAGNOSIS — S3992XA Unspecified injury of lower back, initial encounter: Secondary | ICD-10-CM | POA: Diagnosis not present

## 2017-05-13 DIAGNOSIS — M79606 Pain in leg, unspecified: Secondary | ICD-10-CM | POA: Diagnosis not present

## 2017-05-13 DIAGNOSIS — Z9181 History of falling: Secondary | ICD-10-CM | POA: Diagnosis not present

## 2017-05-13 DIAGNOSIS — R531 Weakness: Secondary | ICD-10-CM | POA: Diagnosis not present

## 2017-05-13 DIAGNOSIS — M545 Low back pain: Secondary | ICD-10-CM | POA: Diagnosis not present

## 2017-05-13 DIAGNOSIS — M25551 Pain in right hip: Secondary | ICD-10-CM | POA: Diagnosis not present

## 2017-05-13 DIAGNOSIS — S79911A Unspecified injury of right hip, initial encounter: Secondary | ICD-10-CM | POA: Diagnosis not present

## 2017-05-13 DIAGNOSIS — S0990XA Unspecified injury of head, initial encounter: Secondary | ICD-10-CM | POA: Diagnosis not present

## 2017-05-13 DIAGNOSIS — S79912A Unspecified injury of left hip, initial encounter: Secondary | ICD-10-CM | POA: Diagnosis not present

## 2017-05-13 DIAGNOSIS — R410 Disorientation, unspecified: Secondary | ICD-10-CM | POA: Diagnosis not present

## 2017-05-14 DIAGNOSIS — S79911A Unspecified injury of right hip, initial encounter: Secondary | ICD-10-CM | POA: Diagnosis not present

## 2017-05-14 DIAGNOSIS — M545 Low back pain: Secondary | ICD-10-CM | POA: Diagnosis not present

## 2017-05-14 DIAGNOSIS — M25552 Pain in left hip: Secondary | ICD-10-CM | POA: Diagnosis not present

## 2017-05-14 DIAGNOSIS — S3992XA Unspecified injury of lower back, initial encounter: Secondary | ICD-10-CM | POA: Diagnosis not present

## 2017-05-14 DIAGNOSIS — S79912A Unspecified injury of left hip, initial encounter: Secondary | ICD-10-CM | POA: Diagnosis not present

## 2017-05-14 DIAGNOSIS — R410 Disorientation, unspecified: Secondary | ICD-10-CM | POA: Diagnosis not present

## 2017-05-14 DIAGNOSIS — S0990XA Unspecified injury of head, initial encounter: Secondary | ICD-10-CM | POA: Diagnosis not present

## 2017-05-14 DIAGNOSIS — M25551 Pain in right hip: Secondary | ICD-10-CM | POA: Diagnosis not present

## 2017-05-15 DIAGNOSIS — I10 Essential (primary) hypertension: Secondary | ICD-10-CM | POA: Diagnosis not present

## 2017-05-15 DIAGNOSIS — I2510 Atherosclerotic heart disease of native coronary artery without angina pectoris: Secondary | ICD-10-CM | POA: Diagnosis not present

## 2017-05-15 DIAGNOSIS — R296 Repeated falls: Secondary | ICD-10-CM | POA: Diagnosis not present

## 2017-05-15 DIAGNOSIS — F0391 Unspecified dementia with behavioral disturbance: Secondary | ICD-10-CM | POA: Diagnosis not present

## 2017-05-15 DIAGNOSIS — E119 Type 2 diabetes mellitus without complications: Secondary | ICD-10-CM | POA: Diagnosis not present

## 2017-05-15 DIAGNOSIS — R2689 Other abnormalities of gait and mobility: Secondary | ICD-10-CM | POA: Diagnosis not present

## 2017-05-20 DIAGNOSIS — I10 Essential (primary) hypertension: Secondary | ICD-10-CM | POA: Diagnosis not present

## 2017-05-20 DIAGNOSIS — F0391 Unspecified dementia with behavioral disturbance: Secondary | ICD-10-CM | POA: Diagnosis not present

## 2017-05-20 DIAGNOSIS — R2689 Other abnormalities of gait and mobility: Secondary | ICD-10-CM | POA: Diagnosis not present

## 2017-05-20 DIAGNOSIS — E119 Type 2 diabetes mellitus without complications: Secondary | ICD-10-CM | POA: Diagnosis not present

## 2017-05-20 DIAGNOSIS — I2510 Atherosclerotic heart disease of native coronary artery without angina pectoris: Secondary | ICD-10-CM | POA: Diagnosis not present

## 2017-05-20 DIAGNOSIS — R296 Repeated falls: Secondary | ICD-10-CM | POA: Diagnosis not present

## 2017-05-21 DIAGNOSIS — R2689 Other abnormalities of gait and mobility: Secondary | ICD-10-CM | POA: Diagnosis not present

## 2017-05-21 DIAGNOSIS — I10 Essential (primary) hypertension: Secondary | ICD-10-CM | POA: Diagnosis not present

## 2017-05-21 DIAGNOSIS — F0391 Unspecified dementia with behavioral disturbance: Secondary | ICD-10-CM | POA: Diagnosis not present

## 2017-05-21 DIAGNOSIS — R296 Repeated falls: Secondary | ICD-10-CM | POA: Diagnosis not present

## 2017-05-21 DIAGNOSIS — I2510 Atherosclerotic heart disease of native coronary artery without angina pectoris: Secondary | ICD-10-CM | POA: Diagnosis not present

## 2017-05-21 DIAGNOSIS — E119 Type 2 diabetes mellitus without complications: Secondary | ICD-10-CM | POA: Diagnosis not present

## 2017-05-22 DIAGNOSIS — R296 Repeated falls: Secondary | ICD-10-CM | POA: Diagnosis not present

## 2017-05-22 DIAGNOSIS — I10 Essential (primary) hypertension: Secondary | ICD-10-CM | POA: Diagnosis not present

## 2017-05-22 DIAGNOSIS — I2510 Atherosclerotic heart disease of native coronary artery without angina pectoris: Secondary | ICD-10-CM | POA: Diagnosis not present

## 2017-05-22 DIAGNOSIS — F0391 Unspecified dementia with behavioral disturbance: Secondary | ICD-10-CM | POA: Diagnosis not present

## 2017-05-22 DIAGNOSIS — R2689 Other abnormalities of gait and mobility: Secondary | ICD-10-CM | POA: Diagnosis not present

## 2017-05-22 DIAGNOSIS — E119 Type 2 diabetes mellitus without complications: Secondary | ICD-10-CM | POA: Diagnosis not present

## 2017-05-23 DIAGNOSIS — I2510 Atherosclerotic heart disease of native coronary artery without angina pectoris: Secondary | ICD-10-CM | POA: Diagnosis not present

## 2017-05-23 DIAGNOSIS — E119 Type 2 diabetes mellitus without complications: Secondary | ICD-10-CM | POA: Diagnosis not present

## 2017-05-23 DIAGNOSIS — I10 Essential (primary) hypertension: Secondary | ICD-10-CM | POA: Diagnosis not present

## 2017-05-23 DIAGNOSIS — R2689 Other abnormalities of gait and mobility: Secondary | ICD-10-CM | POA: Diagnosis not present

## 2017-05-23 DIAGNOSIS — R296 Repeated falls: Secondary | ICD-10-CM | POA: Diagnosis not present

## 2017-05-23 DIAGNOSIS — F0391 Unspecified dementia with behavioral disturbance: Secondary | ICD-10-CM | POA: Diagnosis not present

## 2017-05-24 DIAGNOSIS — F0391 Unspecified dementia with behavioral disturbance: Secondary | ICD-10-CM | POA: Diagnosis not present

## 2017-05-24 DIAGNOSIS — R296 Repeated falls: Secondary | ICD-10-CM | POA: Diagnosis not present

## 2017-05-24 DIAGNOSIS — E119 Type 2 diabetes mellitus without complications: Secondary | ICD-10-CM | POA: Diagnosis not present

## 2017-05-24 DIAGNOSIS — I2510 Atherosclerotic heart disease of native coronary artery without angina pectoris: Secondary | ICD-10-CM | POA: Diagnosis not present

## 2017-05-24 DIAGNOSIS — R2689 Other abnormalities of gait and mobility: Secondary | ICD-10-CM | POA: Diagnosis not present

## 2017-05-24 DIAGNOSIS — I10 Essential (primary) hypertension: Secondary | ICD-10-CM | POA: Diagnosis not present

## 2017-05-27 DIAGNOSIS — I10 Essential (primary) hypertension: Secondary | ICD-10-CM | POA: Diagnosis not present

## 2017-05-27 DIAGNOSIS — F0391 Unspecified dementia with behavioral disturbance: Secondary | ICD-10-CM | POA: Diagnosis not present

## 2017-05-27 DIAGNOSIS — E119 Type 2 diabetes mellitus without complications: Secondary | ICD-10-CM | POA: Diagnosis not present

## 2017-05-27 DIAGNOSIS — R296 Repeated falls: Secondary | ICD-10-CM | POA: Diagnosis not present

## 2017-05-27 DIAGNOSIS — I2510 Atherosclerotic heart disease of native coronary artery without angina pectoris: Secondary | ICD-10-CM | POA: Diagnosis not present

## 2017-05-27 DIAGNOSIS — R2689 Other abnormalities of gait and mobility: Secondary | ICD-10-CM | POA: Diagnosis not present

## 2017-05-29 DIAGNOSIS — R2689 Other abnormalities of gait and mobility: Secondary | ICD-10-CM | POA: Diagnosis not present

## 2017-05-29 DIAGNOSIS — R296 Repeated falls: Secondary | ICD-10-CM | POA: Diagnosis not present

## 2017-05-29 DIAGNOSIS — E119 Type 2 diabetes mellitus without complications: Secondary | ICD-10-CM | POA: Diagnosis not present

## 2017-05-29 DIAGNOSIS — I10 Essential (primary) hypertension: Secondary | ICD-10-CM | POA: Diagnosis not present

## 2017-05-29 DIAGNOSIS — I2510 Atherosclerotic heart disease of native coronary artery without angina pectoris: Secondary | ICD-10-CM | POA: Diagnosis not present

## 2017-05-29 DIAGNOSIS — F0391 Unspecified dementia with behavioral disturbance: Secondary | ICD-10-CM | POA: Diagnosis not present

## 2017-06-03 DIAGNOSIS — F0391 Unspecified dementia with behavioral disturbance: Secondary | ICD-10-CM | POA: Diagnosis not present

## 2017-06-03 DIAGNOSIS — I2510 Atherosclerotic heart disease of native coronary artery without angina pectoris: Secondary | ICD-10-CM | POA: Diagnosis not present

## 2017-06-03 DIAGNOSIS — R2689 Other abnormalities of gait and mobility: Secondary | ICD-10-CM | POA: Diagnosis not present

## 2017-06-03 DIAGNOSIS — I10 Essential (primary) hypertension: Secondary | ICD-10-CM | POA: Diagnosis not present

## 2017-06-03 DIAGNOSIS — R296 Repeated falls: Secondary | ICD-10-CM | POA: Diagnosis not present

## 2017-06-03 DIAGNOSIS — E119 Type 2 diabetes mellitus without complications: Secondary | ICD-10-CM | POA: Diagnosis not present

## 2017-06-05 DIAGNOSIS — R296 Repeated falls: Secondary | ICD-10-CM | POA: Diagnosis not present

## 2017-06-05 DIAGNOSIS — R2689 Other abnormalities of gait and mobility: Secondary | ICD-10-CM | POA: Diagnosis not present

## 2017-06-05 DIAGNOSIS — E119 Type 2 diabetes mellitus without complications: Secondary | ICD-10-CM | POA: Diagnosis not present

## 2017-06-05 DIAGNOSIS — F0391 Unspecified dementia with behavioral disturbance: Secondary | ICD-10-CM | POA: Diagnosis not present

## 2017-06-05 DIAGNOSIS — I10 Essential (primary) hypertension: Secondary | ICD-10-CM | POA: Diagnosis not present

## 2017-06-05 DIAGNOSIS — I2510 Atherosclerotic heart disease of native coronary artery without angina pectoris: Secondary | ICD-10-CM | POA: Diagnosis not present

## 2017-06-10 DIAGNOSIS — R296 Repeated falls: Secondary | ICD-10-CM | POA: Diagnosis not present

## 2017-06-10 DIAGNOSIS — R2689 Other abnormalities of gait and mobility: Secondary | ICD-10-CM | POA: Diagnosis not present

## 2017-06-10 DIAGNOSIS — E119 Type 2 diabetes mellitus without complications: Secondary | ICD-10-CM | POA: Diagnosis not present

## 2017-06-10 DIAGNOSIS — I10 Essential (primary) hypertension: Secondary | ICD-10-CM | POA: Diagnosis not present

## 2017-06-10 DIAGNOSIS — I2510 Atherosclerotic heart disease of native coronary artery without angina pectoris: Secondary | ICD-10-CM | POA: Diagnosis not present

## 2017-06-10 DIAGNOSIS — F0391 Unspecified dementia with behavioral disturbance: Secondary | ICD-10-CM | POA: Diagnosis not present

## 2017-06-11 DIAGNOSIS — R2689 Other abnormalities of gait and mobility: Secondary | ICD-10-CM | POA: Diagnosis not present

## 2017-06-11 DIAGNOSIS — I2510 Atherosclerotic heart disease of native coronary artery without angina pectoris: Secondary | ICD-10-CM | POA: Diagnosis not present

## 2017-06-11 DIAGNOSIS — I10 Essential (primary) hypertension: Secondary | ICD-10-CM | POA: Diagnosis not present

## 2017-06-11 DIAGNOSIS — R296 Repeated falls: Secondary | ICD-10-CM | POA: Diagnosis not present

## 2017-06-11 DIAGNOSIS — F0391 Unspecified dementia with behavioral disturbance: Secondary | ICD-10-CM | POA: Diagnosis not present

## 2017-06-11 DIAGNOSIS — E119 Type 2 diabetes mellitus without complications: Secondary | ICD-10-CM | POA: Diagnosis not present

## 2017-06-12 DIAGNOSIS — I2510 Atherosclerotic heart disease of native coronary artery without angina pectoris: Secondary | ICD-10-CM | POA: Diagnosis not present

## 2017-06-12 DIAGNOSIS — I10 Essential (primary) hypertension: Secondary | ICD-10-CM | POA: Diagnosis not present

## 2017-06-12 DIAGNOSIS — F0391 Unspecified dementia with behavioral disturbance: Secondary | ICD-10-CM | POA: Diagnosis not present

## 2017-06-12 DIAGNOSIS — E119 Type 2 diabetes mellitus without complications: Secondary | ICD-10-CM | POA: Diagnosis not present

## 2017-06-12 DIAGNOSIS — R296 Repeated falls: Secondary | ICD-10-CM | POA: Diagnosis not present

## 2017-06-12 DIAGNOSIS — R2689 Other abnormalities of gait and mobility: Secondary | ICD-10-CM | POA: Diagnosis not present

## 2017-06-13 DIAGNOSIS — F0391 Unspecified dementia with behavioral disturbance: Secondary | ICD-10-CM | POA: Diagnosis not present

## 2017-06-13 DIAGNOSIS — E119 Type 2 diabetes mellitus without complications: Secondary | ICD-10-CM | POA: Diagnosis not present

## 2017-06-13 DIAGNOSIS — I10 Essential (primary) hypertension: Secondary | ICD-10-CM | POA: Diagnosis not present

## 2017-06-13 DIAGNOSIS — R2689 Other abnormalities of gait and mobility: Secondary | ICD-10-CM | POA: Diagnosis not present

## 2017-06-13 DIAGNOSIS — I2510 Atherosclerotic heart disease of native coronary artery without angina pectoris: Secondary | ICD-10-CM | POA: Diagnosis not present

## 2017-06-13 DIAGNOSIS — R296 Repeated falls: Secondary | ICD-10-CM | POA: Diagnosis not present

## 2017-06-17 DIAGNOSIS — I2510 Atherosclerotic heart disease of native coronary artery without angina pectoris: Secondary | ICD-10-CM | POA: Diagnosis not present

## 2017-06-17 DIAGNOSIS — E119 Type 2 diabetes mellitus without complications: Secondary | ICD-10-CM | POA: Diagnosis not present

## 2017-06-17 DIAGNOSIS — I10 Essential (primary) hypertension: Secondary | ICD-10-CM | POA: Diagnosis not present

## 2017-06-17 DIAGNOSIS — R2689 Other abnormalities of gait and mobility: Secondary | ICD-10-CM | POA: Diagnosis not present

## 2017-06-17 DIAGNOSIS — R296 Repeated falls: Secondary | ICD-10-CM | POA: Diagnosis not present

## 2017-06-17 DIAGNOSIS — F0391 Unspecified dementia with behavioral disturbance: Secondary | ICD-10-CM | POA: Diagnosis not present

## 2017-06-19 DIAGNOSIS — N3941 Urge incontinence: Secondary | ICD-10-CM | POA: Diagnosis not present

## 2017-06-19 DIAGNOSIS — R2689 Other abnormalities of gait and mobility: Secondary | ICD-10-CM | POA: Diagnosis not present

## 2017-06-19 DIAGNOSIS — E119 Type 2 diabetes mellitus without complications: Secondary | ICD-10-CM | POA: Diagnosis not present

## 2017-06-19 DIAGNOSIS — I2510 Atherosclerotic heart disease of native coronary artery without angina pectoris: Secondary | ICD-10-CM | POA: Diagnosis not present

## 2017-06-19 DIAGNOSIS — R296 Repeated falls: Secondary | ICD-10-CM | POA: Diagnosis not present

## 2017-06-19 DIAGNOSIS — F0391 Unspecified dementia with behavioral disturbance: Secondary | ICD-10-CM | POA: Diagnosis not present

## 2017-06-19 DIAGNOSIS — I10 Essential (primary) hypertension: Secondary | ICD-10-CM | POA: Diagnosis not present

## 2017-06-20 DIAGNOSIS — R2689 Other abnormalities of gait and mobility: Secondary | ICD-10-CM | POA: Diagnosis not present

## 2017-06-20 DIAGNOSIS — I2510 Atherosclerotic heart disease of native coronary artery without angina pectoris: Secondary | ICD-10-CM | POA: Diagnosis not present

## 2017-06-20 DIAGNOSIS — R296 Repeated falls: Secondary | ICD-10-CM | POA: Diagnosis not present

## 2017-06-20 DIAGNOSIS — F0391 Unspecified dementia with behavioral disturbance: Secondary | ICD-10-CM | POA: Diagnosis not present

## 2017-06-20 DIAGNOSIS — E119 Type 2 diabetes mellitus without complications: Secondary | ICD-10-CM | POA: Diagnosis not present

## 2017-06-20 DIAGNOSIS — I10 Essential (primary) hypertension: Secondary | ICD-10-CM | POA: Diagnosis not present

## 2017-06-23 DIAGNOSIS — E1165 Type 2 diabetes mellitus with hyperglycemia: Secondary | ICD-10-CM | POA: Diagnosis not present

## 2017-06-23 DIAGNOSIS — I2510 Atherosclerotic heart disease of native coronary artery without angina pectoris: Secondary | ICD-10-CM | POA: Diagnosis not present

## 2017-06-23 DIAGNOSIS — I10 Essential (primary) hypertension: Secondary | ICD-10-CM | POA: Diagnosis not present

## 2017-06-23 DIAGNOSIS — E039 Hypothyroidism, unspecified: Secondary | ICD-10-CM | POA: Diagnosis not present

## 2017-06-23 DIAGNOSIS — R270 Ataxia, unspecified: Secondary | ICD-10-CM | POA: Diagnosis not present

## 2017-06-24 DIAGNOSIS — R2689 Other abnormalities of gait and mobility: Secondary | ICD-10-CM | POA: Diagnosis not present

## 2017-06-24 DIAGNOSIS — I10 Essential (primary) hypertension: Secondary | ICD-10-CM | POA: Diagnosis not present

## 2017-06-24 DIAGNOSIS — I2510 Atherosclerotic heart disease of native coronary artery without angina pectoris: Secondary | ICD-10-CM | POA: Diagnosis not present

## 2017-06-24 DIAGNOSIS — F0391 Unspecified dementia with behavioral disturbance: Secondary | ICD-10-CM | POA: Diagnosis not present

## 2017-06-24 DIAGNOSIS — R296 Repeated falls: Secondary | ICD-10-CM | POA: Diagnosis not present

## 2017-06-24 DIAGNOSIS — E119 Type 2 diabetes mellitus without complications: Secondary | ICD-10-CM | POA: Diagnosis not present

## 2017-06-26 DIAGNOSIS — F0391 Unspecified dementia with behavioral disturbance: Secondary | ICD-10-CM | POA: Diagnosis not present

## 2017-06-26 DIAGNOSIS — I10 Essential (primary) hypertension: Secondary | ICD-10-CM | POA: Diagnosis not present

## 2017-06-26 DIAGNOSIS — R2689 Other abnormalities of gait and mobility: Secondary | ICD-10-CM | POA: Diagnosis not present

## 2017-06-26 DIAGNOSIS — E119 Type 2 diabetes mellitus without complications: Secondary | ICD-10-CM | POA: Diagnosis not present

## 2017-06-26 DIAGNOSIS — R296 Repeated falls: Secondary | ICD-10-CM | POA: Diagnosis not present

## 2017-06-26 DIAGNOSIS — I2510 Atherosclerotic heart disease of native coronary artery without angina pectoris: Secondary | ICD-10-CM | POA: Diagnosis not present

## 2017-06-27 DIAGNOSIS — E119 Type 2 diabetes mellitus without complications: Secondary | ICD-10-CM | POA: Diagnosis not present

## 2017-06-27 DIAGNOSIS — I10 Essential (primary) hypertension: Secondary | ICD-10-CM | POA: Diagnosis not present

## 2017-06-27 DIAGNOSIS — I2510 Atherosclerotic heart disease of native coronary artery without angina pectoris: Secondary | ICD-10-CM | POA: Diagnosis not present

## 2017-06-27 DIAGNOSIS — R2689 Other abnormalities of gait and mobility: Secondary | ICD-10-CM | POA: Diagnosis not present

## 2017-06-27 DIAGNOSIS — F0391 Unspecified dementia with behavioral disturbance: Secondary | ICD-10-CM | POA: Diagnosis not present

## 2017-06-27 DIAGNOSIS — R296 Repeated falls: Secondary | ICD-10-CM | POA: Diagnosis not present

## 2017-06-30 DIAGNOSIS — I2510 Atherosclerotic heart disease of native coronary artery without angina pectoris: Secondary | ICD-10-CM | POA: Diagnosis not present

## 2017-06-30 DIAGNOSIS — E119 Type 2 diabetes mellitus without complications: Secondary | ICD-10-CM | POA: Diagnosis not present

## 2017-06-30 DIAGNOSIS — I10 Essential (primary) hypertension: Secondary | ICD-10-CM | POA: Diagnosis not present

## 2017-06-30 DIAGNOSIS — R296 Repeated falls: Secondary | ICD-10-CM | POA: Diagnosis not present

## 2017-06-30 DIAGNOSIS — F0391 Unspecified dementia with behavioral disturbance: Secondary | ICD-10-CM | POA: Diagnosis not present

## 2017-06-30 DIAGNOSIS — R2689 Other abnormalities of gait and mobility: Secondary | ICD-10-CM | POA: Diagnosis not present

## 2017-07-02 DIAGNOSIS — R2689 Other abnormalities of gait and mobility: Secondary | ICD-10-CM | POA: Diagnosis not present

## 2017-07-02 DIAGNOSIS — E119 Type 2 diabetes mellitus without complications: Secondary | ICD-10-CM | POA: Diagnosis not present

## 2017-07-02 DIAGNOSIS — R296 Repeated falls: Secondary | ICD-10-CM | POA: Diagnosis not present

## 2017-07-02 DIAGNOSIS — I2510 Atherosclerotic heart disease of native coronary artery without angina pectoris: Secondary | ICD-10-CM | POA: Diagnosis not present

## 2017-07-02 DIAGNOSIS — F0391 Unspecified dementia with behavioral disturbance: Secondary | ICD-10-CM | POA: Diagnosis not present

## 2017-07-02 DIAGNOSIS — I10 Essential (primary) hypertension: Secondary | ICD-10-CM | POA: Diagnosis not present

## 2017-07-03 DIAGNOSIS — I2510 Atherosclerotic heart disease of native coronary artery without angina pectoris: Secondary | ICD-10-CM | POA: Diagnosis not present

## 2017-07-03 DIAGNOSIS — F0391 Unspecified dementia with behavioral disturbance: Secondary | ICD-10-CM | POA: Diagnosis not present

## 2017-07-03 DIAGNOSIS — R296 Repeated falls: Secondary | ICD-10-CM | POA: Diagnosis not present

## 2017-07-03 DIAGNOSIS — R2689 Other abnormalities of gait and mobility: Secondary | ICD-10-CM | POA: Diagnosis not present

## 2017-07-03 DIAGNOSIS — I10 Essential (primary) hypertension: Secondary | ICD-10-CM | POA: Diagnosis not present

## 2017-07-03 DIAGNOSIS — E119 Type 2 diabetes mellitus without complications: Secondary | ICD-10-CM | POA: Diagnosis not present

## 2017-07-04 DIAGNOSIS — I10 Essential (primary) hypertension: Secondary | ICD-10-CM | POA: Diagnosis not present

## 2017-07-04 DIAGNOSIS — I2510 Atherosclerotic heart disease of native coronary artery without angina pectoris: Secondary | ICD-10-CM | POA: Diagnosis not present

## 2017-07-04 DIAGNOSIS — F0391 Unspecified dementia with behavioral disturbance: Secondary | ICD-10-CM | POA: Diagnosis not present

## 2017-07-04 DIAGNOSIS — R296 Repeated falls: Secondary | ICD-10-CM | POA: Diagnosis not present

## 2017-07-04 DIAGNOSIS — R2689 Other abnormalities of gait and mobility: Secondary | ICD-10-CM | POA: Diagnosis not present

## 2017-07-04 DIAGNOSIS — E119 Type 2 diabetes mellitus without complications: Secondary | ICD-10-CM | POA: Diagnosis not present

## 2017-07-08 DIAGNOSIS — I2510 Atherosclerotic heart disease of native coronary artery without angina pectoris: Secondary | ICD-10-CM | POA: Diagnosis not present

## 2017-07-08 DIAGNOSIS — I10 Essential (primary) hypertension: Secondary | ICD-10-CM | POA: Diagnosis not present

## 2017-07-08 DIAGNOSIS — R296 Repeated falls: Secondary | ICD-10-CM | POA: Diagnosis not present

## 2017-07-08 DIAGNOSIS — F0391 Unspecified dementia with behavioral disturbance: Secondary | ICD-10-CM | POA: Diagnosis not present

## 2017-07-08 DIAGNOSIS — R2689 Other abnormalities of gait and mobility: Secondary | ICD-10-CM | POA: Diagnosis not present

## 2017-07-08 DIAGNOSIS — E119 Type 2 diabetes mellitus without complications: Secondary | ICD-10-CM | POA: Diagnosis not present

## 2017-07-10 DIAGNOSIS — F0391 Unspecified dementia with behavioral disturbance: Secondary | ICD-10-CM | POA: Diagnosis not present

## 2017-07-10 DIAGNOSIS — R2689 Other abnormalities of gait and mobility: Secondary | ICD-10-CM | POA: Diagnosis not present

## 2017-07-10 DIAGNOSIS — R296 Repeated falls: Secondary | ICD-10-CM | POA: Diagnosis not present

## 2017-07-10 DIAGNOSIS — I10 Essential (primary) hypertension: Secondary | ICD-10-CM | POA: Diagnosis not present

## 2017-07-10 DIAGNOSIS — I2510 Atherosclerotic heart disease of native coronary artery without angina pectoris: Secondary | ICD-10-CM | POA: Diagnosis not present

## 2017-07-10 DIAGNOSIS — E119 Type 2 diabetes mellitus without complications: Secondary | ICD-10-CM | POA: Diagnosis not present

## 2017-07-14 DIAGNOSIS — R2689 Other abnormalities of gait and mobility: Secondary | ICD-10-CM | POA: Diagnosis not present

## 2017-07-14 DIAGNOSIS — F0391 Unspecified dementia with behavioral disturbance: Secondary | ICD-10-CM | POA: Diagnosis not present

## 2017-07-14 DIAGNOSIS — I2510 Atherosclerotic heart disease of native coronary artery without angina pectoris: Secondary | ICD-10-CM | POA: Diagnosis not present

## 2017-07-14 DIAGNOSIS — E119 Type 2 diabetes mellitus without complications: Secondary | ICD-10-CM | POA: Diagnosis not present

## 2017-07-14 DIAGNOSIS — D510 Vitamin B12 deficiency anemia due to intrinsic factor deficiency: Secondary | ICD-10-CM | POA: Diagnosis not present

## 2017-07-14 DIAGNOSIS — I10 Essential (primary) hypertension: Secondary | ICD-10-CM | POA: Diagnosis not present

## 2017-07-17 DIAGNOSIS — F0391 Unspecified dementia with behavioral disturbance: Secondary | ICD-10-CM | POA: Diagnosis not present

## 2017-07-17 DIAGNOSIS — R2689 Other abnormalities of gait and mobility: Secondary | ICD-10-CM | POA: Diagnosis not present

## 2017-07-17 DIAGNOSIS — D510 Vitamin B12 deficiency anemia due to intrinsic factor deficiency: Secondary | ICD-10-CM | POA: Diagnosis not present

## 2017-07-17 DIAGNOSIS — I2510 Atherosclerotic heart disease of native coronary artery without angina pectoris: Secondary | ICD-10-CM | POA: Diagnosis not present

## 2017-07-17 DIAGNOSIS — E119 Type 2 diabetes mellitus without complications: Secondary | ICD-10-CM | POA: Diagnosis not present

## 2017-07-17 DIAGNOSIS — I10 Essential (primary) hypertension: Secondary | ICD-10-CM | POA: Diagnosis not present

## 2017-07-18 DIAGNOSIS — R2689 Other abnormalities of gait and mobility: Secondary | ICD-10-CM | POA: Diagnosis not present

## 2017-07-18 DIAGNOSIS — E119 Type 2 diabetes mellitus without complications: Secondary | ICD-10-CM | POA: Diagnosis not present

## 2017-07-18 DIAGNOSIS — I10 Essential (primary) hypertension: Secondary | ICD-10-CM | POA: Diagnosis not present

## 2017-07-18 DIAGNOSIS — I2510 Atherosclerotic heart disease of native coronary artery without angina pectoris: Secondary | ICD-10-CM | POA: Diagnosis not present

## 2017-07-18 DIAGNOSIS — D510 Vitamin B12 deficiency anemia due to intrinsic factor deficiency: Secondary | ICD-10-CM | POA: Diagnosis not present

## 2017-07-18 DIAGNOSIS — F0391 Unspecified dementia with behavioral disturbance: Secondary | ICD-10-CM | POA: Diagnosis not present

## 2017-07-23 DIAGNOSIS — D510 Vitamin B12 deficiency anemia due to intrinsic factor deficiency: Secondary | ICD-10-CM | POA: Diagnosis not present

## 2017-07-23 DIAGNOSIS — F0391 Unspecified dementia with behavioral disturbance: Secondary | ICD-10-CM | POA: Diagnosis not present

## 2017-07-23 DIAGNOSIS — I10 Essential (primary) hypertension: Secondary | ICD-10-CM | POA: Diagnosis not present

## 2017-07-23 DIAGNOSIS — I2510 Atherosclerotic heart disease of native coronary artery without angina pectoris: Secondary | ICD-10-CM | POA: Diagnosis not present

## 2017-07-23 DIAGNOSIS — R2689 Other abnormalities of gait and mobility: Secondary | ICD-10-CM | POA: Diagnosis not present

## 2017-07-23 DIAGNOSIS — E119 Type 2 diabetes mellitus without complications: Secondary | ICD-10-CM | POA: Diagnosis not present

## 2017-07-25 DIAGNOSIS — D510 Vitamin B12 deficiency anemia due to intrinsic factor deficiency: Secondary | ICD-10-CM | POA: Diagnosis not present

## 2017-07-25 DIAGNOSIS — I10 Essential (primary) hypertension: Secondary | ICD-10-CM | POA: Diagnosis not present

## 2017-07-25 DIAGNOSIS — F0391 Unspecified dementia with behavioral disturbance: Secondary | ICD-10-CM | POA: Diagnosis not present

## 2017-07-25 DIAGNOSIS — I2510 Atherosclerotic heart disease of native coronary artery without angina pectoris: Secondary | ICD-10-CM | POA: Diagnosis not present

## 2017-07-25 DIAGNOSIS — R2689 Other abnormalities of gait and mobility: Secondary | ICD-10-CM | POA: Diagnosis not present

## 2017-07-25 DIAGNOSIS — E119 Type 2 diabetes mellitus without complications: Secondary | ICD-10-CM | POA: Diagnosis not present

## 2017-07-28 DIAGNOSIS — R2689 Other abnormalities of gait and mobility: Secondary | ICD-10-CM | POA: Diagnosis not present

## 2017-07-28 DIAGNOSIS — E119 Type 2 diabetes mellitus without complications: Secondary | ICD-10-CM | POA: Diagnosis not present

## 2017-07-28 DIAGNOSIS — I2510 Atherosclerotic heart disease of native coronary artery without angina pectoris: Secondary | ICD-10-CM | POA: Diagnosis not present

## 2017-07-28 DIAGNOSIS — D510 Vitamin B12 deficiency anemia due to intrinsic factor deficiency: Secondary | ICD-10-CM | POA: Diagnosis not present

## 2017-07-28 DIAGNOSIS — I10 Essential (primary) hypertension: Secondary | ICD-10-CM | POA: Diagnosis not present

## 2017-07-28 DIAGNOSIS — F0391 Unspecified dementia with behavioral disturbance: Secondary | ICD-10-CM | POA: Diagnosis not present

## 2017-07-29 DIAGNOSIS — R220 Localized swelling, mass and lump, head: Secondary | ICD-10-CM | POA: Diagnosis not present

## 2017-07-29 DIAGNOSIS — R221 Localized swelling, mass and lump, neck: Secondary | ICD-10-CM | POA: Diagnosis not present

## 2017-07-29 DIAGNOSIS — S199XXA Unspecified injury of neck, initial encounter: Secondary | ICD-10-CM | POA: Diagnosis not present

## 2017-07-29 DIAGNOSIS — H5789 Other specified disorders of eye and adnexa: Secondary | ICD-10-CM | POA: Diagnosis not present

## 2017-07-29 DIAGNOSIS — S0990XA Unspecified injury of head, initial encounter: Secondary | ICD-10-CM | POA: Diagnosis not present

## 2017-07-29 DIAGNOSIS — R296 Repeated falls: Secondary | ICD-10-CM | POA: Diagnosis not present

## 2017-07-29 DIAGNOSIS — S0011XA Contusion of right eyelid and periocular area, initial encounter: Secondary | ICD-10-CM | POA: Diagnosis not present

## 2017-07-29 DIAGNOSIS — F0390 Unspecified dementia without behavioral disturbance: Secondary | ICD-10-CM | POA: Diagnosis not present

## 2017-07-30 DIAGNOSIS — I10 Essential (primary) hypertension: Secondary | ICD-10-CM | POA: Diagnosis not present

## 2017-07-30 DIAGNOSIS — I2510 Atherosclerotic heart disease of native coronary artery without angina pectoris: Secondary | ICD-10-CM | POA: Diagnosis not present

## 2017-07-30 DIAGNOSIS — E119 Type 2 diabetes mellitus without complications: Secondary | ICD-10-CM | POA: Diagnosis not present

## 2017-07-30 DIAGNOSIS — D510 Vitamin B12 deficiency anemia due to intrinsic factor deficiency: Secondary | ICD-10-CM | POA: Diagnosis not present

## 2017-07-30 DIAGNOSIS — R2689 Other abnormalities of gait and mobility: Secondary | ICD-10-CM | POA: Diagnosis not present

## 2017-07-30 DIAGNOSIS — F0391 Unspecified dementia with behavioral disturbance: Secondary | ICD-10-CM | POA: Diagnosis not present

## 2017-08-01 DIAGNOSIS — F0391 Unspecified dementia with behavioral disturbance: Secondary | ICD-10-CM | POA: Diagnosis not present

## 2017-08-01 DIAGNOSIS — I2510 Atherosclerotic heart disease of native coronary artery without angina pectoris: Secondary | ICD-10-CM | POA: Diagnosis not present

## 2017-08-01 DIAGNOSIS — D510 Vitamin B12 deficiency anemia due to intrinsic factor deficiency: Secondary | ICD-10-CM | POA: Diagnosis not present

## 2017-08-01 DIAGNOSIS — R2689 Other abnormalities of gait and mobility: Secondary | ICD-10-CM | POA: Diagnosis not present

## 2017-08-01 DIAGNOSIS — I10 Essential (primary) hypertension: Secondary | ICD-10-CM | POA: Diagnosis not present

## 2017-08-01 DIAGNOSIS — E119 Type 2 diabetes mellitus without complications: Secondary | ICD-10-CM | POA: Diagnosis not present

## 2017-08-04 DIAGNOSIS — F0391 Unspecified dementia with behavioral disturbance: Secondary | ICD-10-CM | POA: Diagnosis not present

## 2017-08-04 DIAGNOSIS — E119 Type 2 diabetes mellitus without complications: Secondary | ICD-10-CM | POA: Diagnosis not present

## 2017-08-04 DIAGNOSIS — D510 Vitamin B12 deficiency anemia due to intrinsic factor deficiency: Secondary | ICD-10-CM | POA: Diagnosis not present

## 2017-08-04 DIAGNOSIS — I10 Essential (primary) hypertension: Secondary | ICD-10-CM | POA: Diagnosis not present

## 2017-08-04 DIAGNOSIS — I2510 Atherosclerotic heart disease of native coronary artery without angina pectoris: Secondary | ICD-10-CM | POA: Diagnosis not present

## 2017-08-04 DIAGNOSIS — R2689 Other abnormalities of gait and mobility: Secondary | ICD-10-CM | POA: Diagnosis not present

## 2017-08-06 DIAGNOSIS — I10 Essential (primary) hypertension: Secondary | ICD-10-CM | POA: Diagnosis not present

## 2017-08-06 DIAGNOSIS — G4762 Sleep related leg cramps: Secondary | ICD-10-CM | POA: Diagnosis not present

## 2017-08-09 DIAGNOSIS — Z79899 Other long term (current) drug therapy: Secondary | ICD-10-CM | POA: Diagnosis not present

## 2017-08-09 DIAGNOSIS — Z9181 History of falling: Secondary | ICD-10-CM | POA: Diagnosis not present

## 2017-08-09 DIAGNOSIS — M47892 Other spondylosis, cervical region: Secondary | ICD-10-CM | POA: Diagnosis not present

## 2017-08-09 DIAGNOSIS — J449 Chronic obstructive pulmonary disease, unspecified: Secondary | ICD-10-CM | POA: Diagnosis not present

## 2017-08-09 DIAGNOSIS — S0993XA Unspecified injury of face, initial encounter: Secondary | ICD-10-CM | POA: Diagnosis not present

## 2017-08-09 DIAGNOSIS — Z7982 Long term (current) use of aspirin: Secondary | ICD-10-CM | POA: Diagnosis not present

## 2017-08-09 DIAGNOSIS — I63523 Cerebral infarction due to unspecified occlusion or stenosis of bilateral anterior cerebral arteries: Secondary | ICD-10-CM | POA: Diagnosis not present

## 2017-08-09 DIAGNOSIS — Z885 Allergy status to narcotic agent status: Secondary | ICD-10-CM | POA: Diagnosis not present

## 2017-08-09 DIAGNOSIS — S199XXA Unspecified injury of neck, initial encounter: Secondary | ICD-10-CM | POA: Diagnosis not present

## 2017-08-09 DIAGNOSIS — Z7409 Other reduced mobility: Secondary | ICD-10-CM | POA: Diagnosis not present

## 2017-08-09 DIAGNOSIS — R462 Strange and inexplicable behavior: Secondary | ICD-10-CM | POA: Diagnosis not present

## 2017-08-09 DIAGNOSIS — J9611 Chronic respiratory failure with hypoxia: Secondary | ICD-10-CM | POA: Diagnosis not present

## 2017-08-09 DIAGNOSIS — N179 Acute kidney failure, unspecified: Secondary | ICD-10-CM | POA: Diagnosis not present

## 2017-08-09 DIAGNOSIS — R51 Headache: Secondary | ICD-10-CM | POA: Diagnosis not present

## 2017-08-09 DIAGNOSIS — R296 Repeated falls: Secondary | ICD-10-CM | POA: Diagnosis not present

## 2017-08-09 DIAGNOSIS — F0391 Unspecified dementia with behavioral disturbance: Secondary | ICD-10-CM | POA: Diagnosis not present

## 2017-08-09 DIAGNOSIS — I10 Essential (primary) hypertension: Secondary | ICD-10-CM | POA: Diagnosis not present

## 2017-08-09 DIAGNOSIS — D510 Vitamin B12 deficiency anemia due to intrinsic factor deficiency: Secondary | ICD-10-CM | POA: Diagnosis not present

## 2017-08-09 DIAGNOSIS — S0083XA Contusion of other part of head, initial encounter: Secondary | ICD-10-CM | POA: Diagnosis not present

## 2017-08-09 DIAGNOSIS — J418 Mixed simple and mucopurulent chronic bronchitis: Secondary | ICD-10-CM | POA: Diagnosis not present

## 2017-08-09 DIAGNOSIS — I2510 Atherosclerotic heart disease of native coronary artery without angina pectoris: Secondary | ICD-10-CM | POA: Diagnosis not present

## 2017-08-09 DIAGNOSIS — Z87891 Personal history of nicotine dependence: Secondary | ICD-10-CM | POA: Diagnosis not present

## 2017-08-09 DIAGNOSIS — R9431 Abnormal electrocardiogram [ECG] [EKG]: Secondary | ICD-10-CM | POA: Diagnosis not present

## 2017-08-09 DIAGNOSIS — E785 Hyperlipidemia, unspecified: Secondary | ICD-10-CM | POA: Diagnosis not present

## 2017-08-09 DIAGNOSIS — R946 Abnormal results of thyroid function studies: Secondary | ICD-10-CM | POA: Diagnosis not present

## 2017-08-09 DIAGNOSIS — S0990XA Unspecified injury of head, initial encounter: Secondary | ICD-10-CM | POA: Diagnosis not present

## 2017-08-09 DIAGNOSIS — E1151 Type 2 diabetes mellitus with diabetic peripheral angiopathy without gangrene: Secondary | ICD-10-CM | POA: Diagnosis not present

## 2017-08-09 DIAGNOSIS — J9612 Chronic respiratory failure with hypercapnia: Secondary | ICD-10-CM | POA: Diagnosis not present

## 2017-08-09 DIAGNOSIS — E039 Hypothyroidism, unspecified: Secondary | ICD-10-CM | POA: Diagnosis not present

## 2017-08-09 DIAGNOSIS — Z981 Arthrodesis status: Secondary | ICD-10-CM | POA: Diagnosis not present

## 2017-08-10 DIAGNOSIS — R296 Repeated falls: Secondary | ICD-10-CM | POA: Diagnosis not present

## 2017-08-11 DIAGNOSIS — F0281 Dementia in other diseases classified elsewhere with behavioral disturbance: Secondary | ICD-10-CM | POA: Diagnosis not present

## 2017-08-11 DIAGNOSIS — I2510 Atherosclerotic heart disease of native coronary artery without angina pectoris: Secondary | ICD-10-CM | POA: Diagnosis not present

## 2017-08-11 DIAGNOSIS — R296 Repeated falls: Secondary | ICD-10-CM | POA: Diagnosis not present

## 2017-08-11 DIAGNOSIS — J9612 Chronic respiratory failure with hypercapnia: Secondary | ICD-10-CM | POA: Diagnosis not present

## 2017-08-11 DIAGNOSIS — E7800 Pure hypercholesterolemia, unspecified: Secondary | ICD-10-CM | POA: Diagnosis not present

## 2017-08-11 DIAGNOSIS — N179 Acute kidney failure, unspecified: Secondary | ICD-10-CM | POA: Diagnosis not present

## 2017-08-11 DIAGNOSIS — Z7409 Other reduced mobility: Secondary | ICD-10-CM | POA: Diagnosis not present

## 2017-08-11 DIAGNOSIS — J9611 Chronic respiratory failure with hypoxia: Secondary | ICD-10-CM | POA: Diagnosis not present

## 2017-08-12 DIAGNOSIS — I2510 Atherosclerotic heart disease of native coronary artery without angina pectoris: Secondary | ICD-10-CM | POA: Diagnosis not present

## 2017-08-12 DIAGNOSIS — F0281 Dementia in other diseases classified elsewhere with behavioral disturbance: Secondary | ICD-10-CM | POA: Diagnosis not present

## 2017-08-12 DIAGNOSIS — N179 Acute kidney failure, unspecified: Secondary | ICD-10-CM | POA: Diagnosis not present

## 2017-08-12 DIAGNOSIS — E7800 Pure hypercholesterolemia, unspecified: Secondary | ICD-10-CM | POA: Diagnosis not present

## 2017-08-12 DIAGNOSIS — J9612 Chronic respiratory failure with hypercapnia: Secondary | ICD-10-CM | POA: Diagnosis not present

## 2017-08-12 DIAGNOSIS — Z7409 Other reduced mobility: Secondary | ICD-10-CM | POA: Diagnosis not present

## 2017-08-12 DIAGNOSIS — R296 Repeated falls: Secondary | ICD-10-CM | POA: Diagnosis not present

## 2017-08-12 DIAGNOSIS — J9611 Chronic respiratory failure with hypoxia: Secondary | ICD-10-CM | POA: Diagnosis not present

## 2017-09-01 IMAGING — CT CT HEAD W/O CM
3 of 4 series · 14 of 47 positions shown, 16 images · non-contrast
Comparison: None.

CLINICAL DATA: Fall with altered mental status. Initial encounter.

EXAM:
CT HEAD WITHOUT CONTRAST
TECHNIQUE: Contiguous axial images were obtained from the base of the skull
through the vertex without intravenous contrast.

[Series 2: head w/o · axial · non-contrast · 0.47mm/px · z∈[-641,-521]mm · 8 of 30 slices shown, 10 images]
[im 3/30  brain]
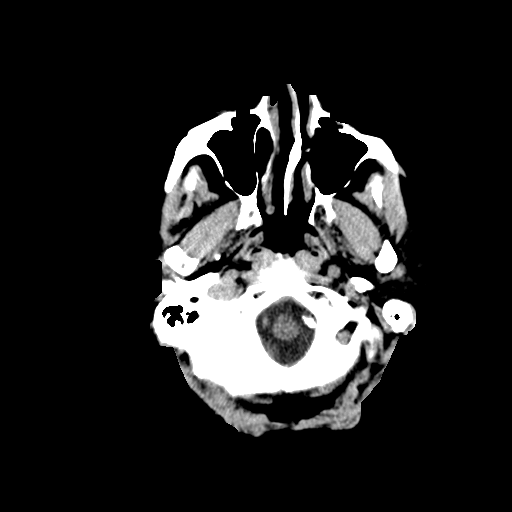
[im 3/30  bone]
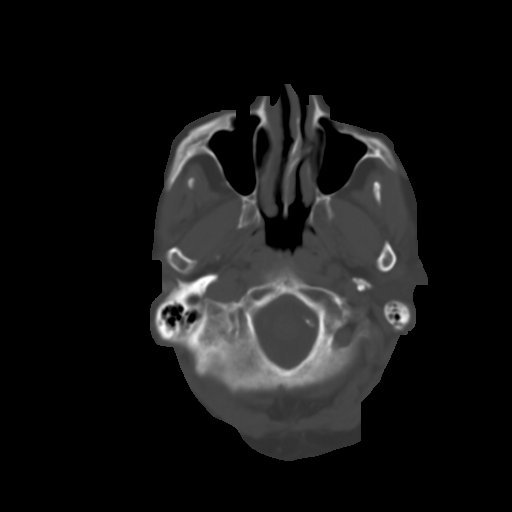
[im 6/30  brain]
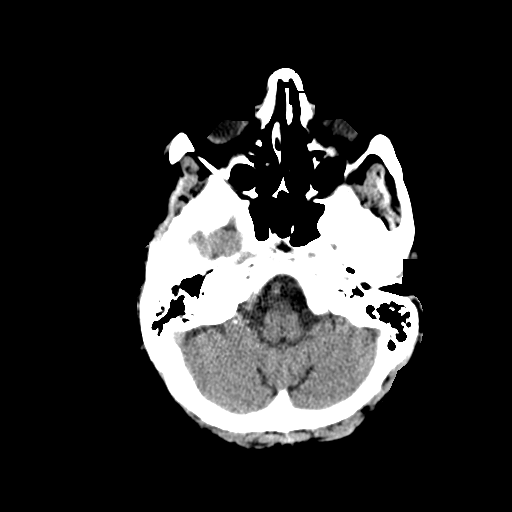
[im 9/30  brain]
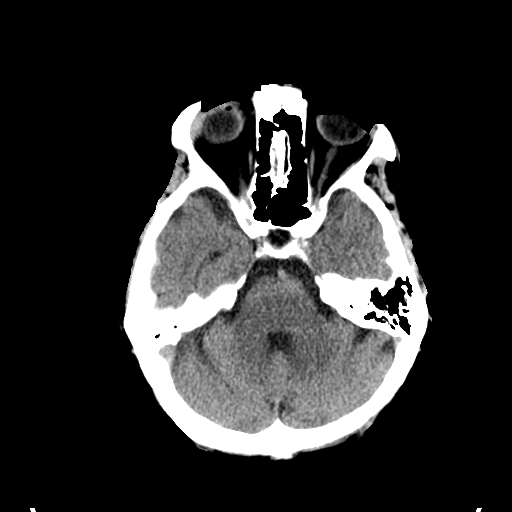
[im 12/30  brain]
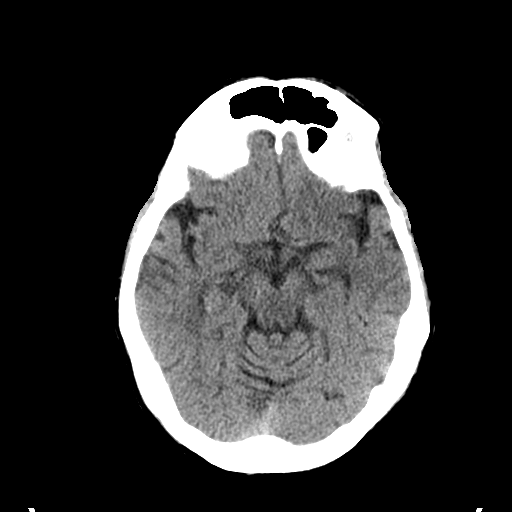
[im 18/30  brain]
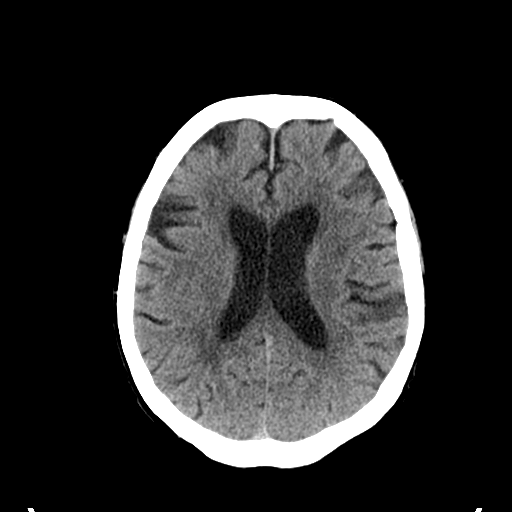
[im 18/30  bone]
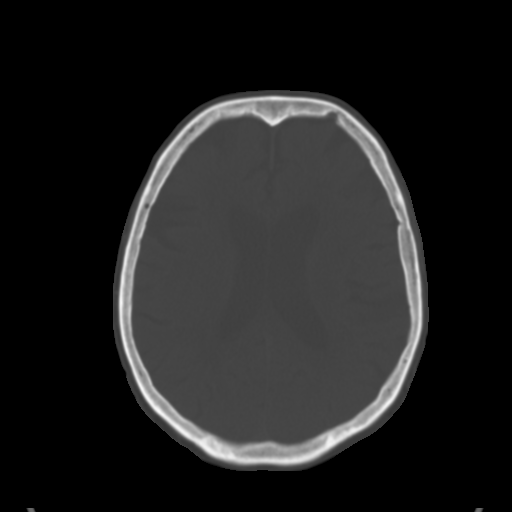
[im 21/30  brain]
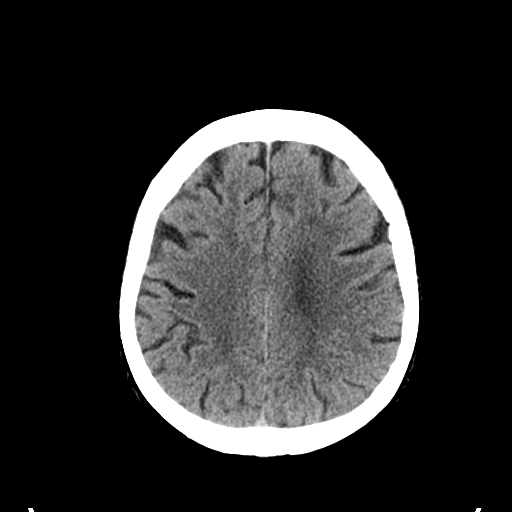
[im 24/30  brain]
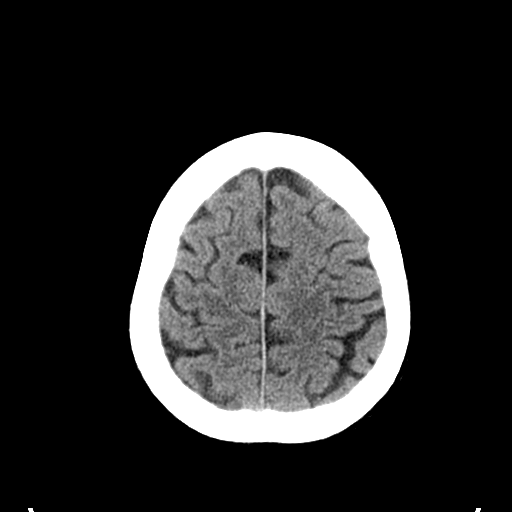
[im 27/30  brain]
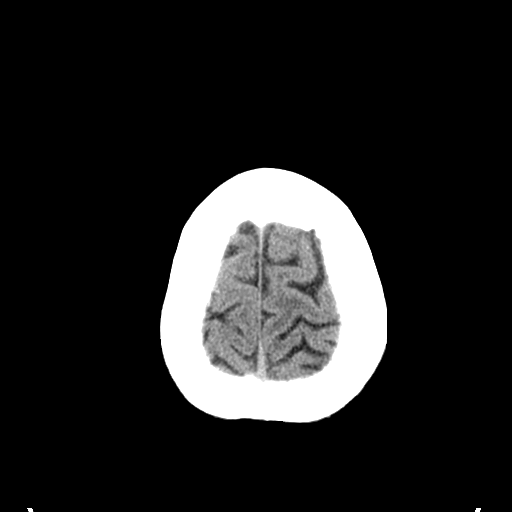

[Series 4: coronal · coronal · 0.26mm/px · 3 of 63 slices shown]
[im 21/63  brain]
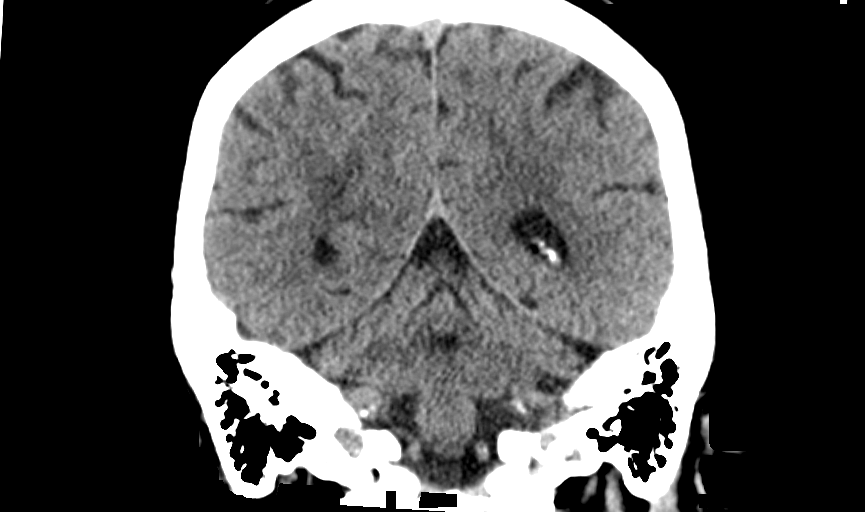
[im 28/63  brain]
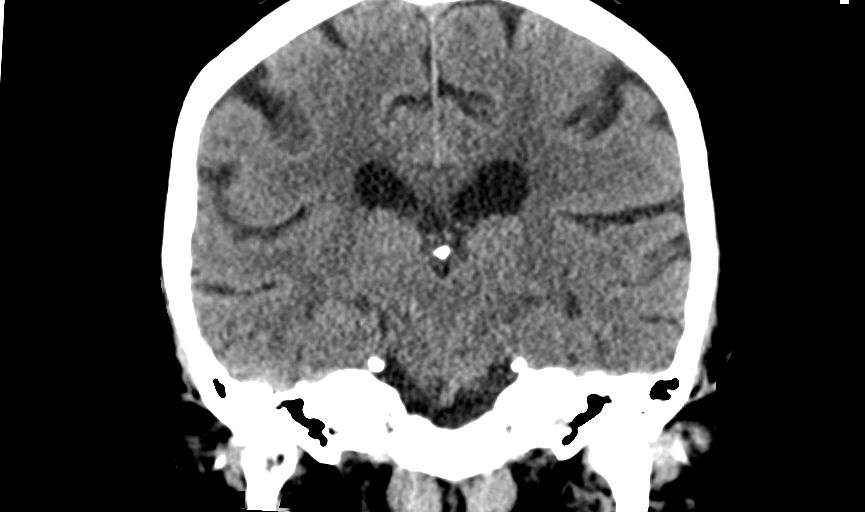
[im 35/63  brain]
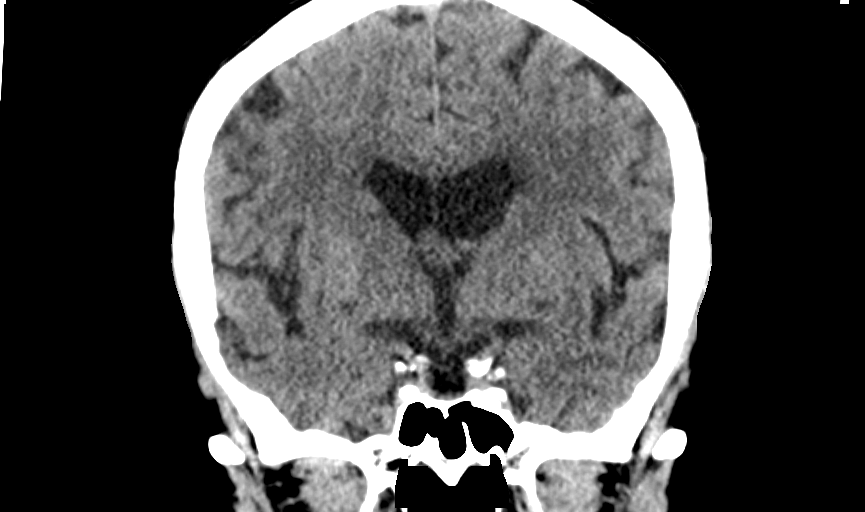

[Series 5: sagittal · sagittal · 0.28mm/px · 3 of 47 slices shown]
[im 16/47  brain]
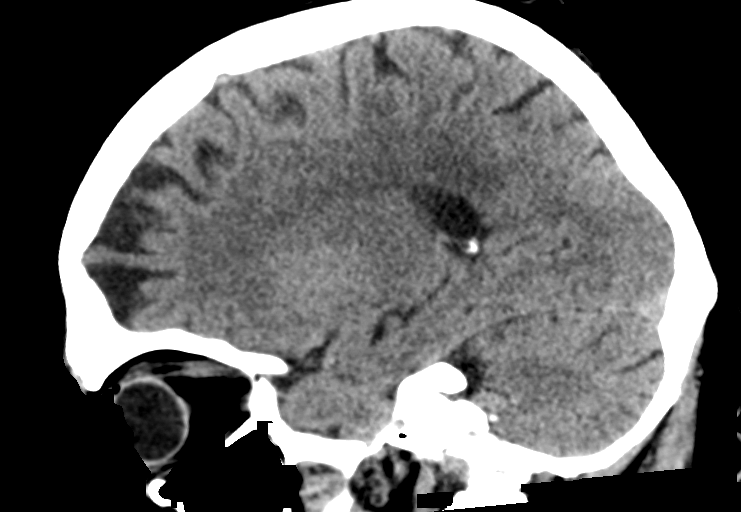
[im 24/47  brain]
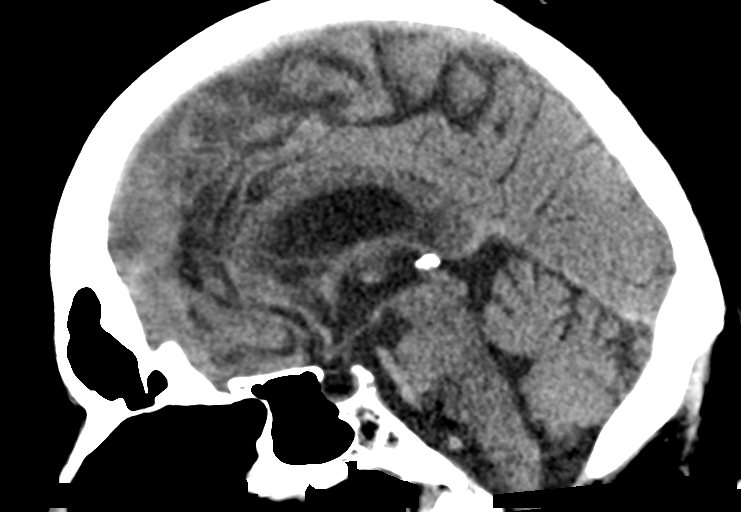
[im 31/47  brain]
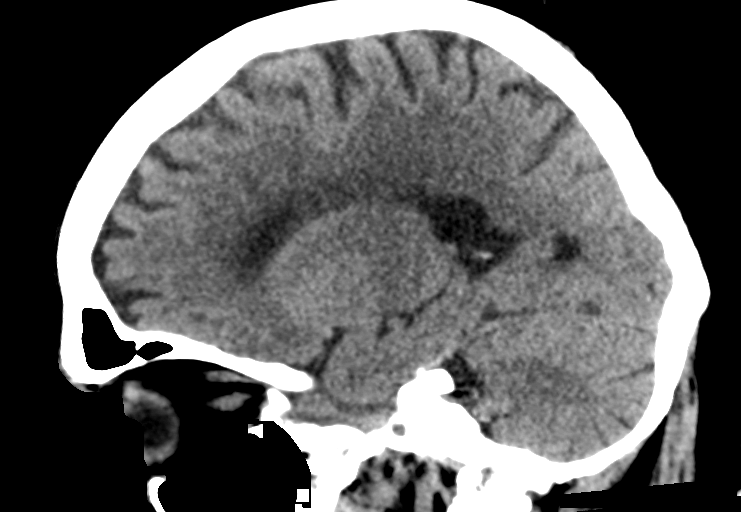

[14 of 47 positions shown; findings below may reference images not displayed]

FINDINGS: Brain: No evidence of acute infarction, hemorrhage, hydrocephalus,
or mass lesion/mass effect. Mild patchy microvascular ischemic
change in the cerebral white matter. Normal cerebral volume for age.

Vascular: Atherosclerotic calcification.

Skull: Negative for fracture or focal lesion.

Sinuses/Orbits: Bilateral cataract resection. Notable large leftward
nasal septal spur with inferior turbinate deformation.

Other: None.
IMPRESSION: 1. No acute finding.
2. Mild chronic white matter disease.

## 2017-09-12 DIAGNOSIS — E118 Type 2 diabetes mellitus with unspecified complications: Secondary | ICD-10-CM | POA: Diagnosis not present

## 2017-09-12 DIAGNOSIS — F0390 Unspecified dementia without behavioral disturbance: Secondary | ICD-10-CM | POA: Diagnosis not present

## 2017-09-12 DIAGNOSIS — E669 Obesity, unspecified: Secondary | ICD-10-CM | POA: Diagnosis not present

## 2017-09-12 DIAGNOSIS — E039 Hypothyroidism, unspecified: Secondary | ICD-10-CM | POA: Diagnosis not present

## 2017-09-12 DIAGNOSIS — Z6835 Body mass index (BMI) 35.0-35.9, adult: Secondary | ICD-10-CM | POA: Diagnosis not present

## 2017-10-28 DIAGNOSIS — Z6836 Body mass index (BMI) 36.0-36.9, adult: Secondary | ICD-10-CM | POA: Diagnosis not present

## 2017-10-28 DIAGNOSIS — E6609 Other obesity due to excess calories: Secondary | ICD-10-CM | POA: Diagnosis not present

## 2017-10-28 DIAGNOSIS — E039 Hypothyroidism, unspecified: Secondary | ICD-10-CM | POA: Diagnosis not present

## 2017-10-28 DIAGNOSIS — R270 Ataxia, unspecified: Secondary | ICD-10-CM | POA: Diagnosis not present

## 2017-10-28 DIAGNOSIS — E1141 Type 2 diabetes mellitus with diabetic mononeuropathy: Secondary | ICD-10-CM | POA: Diagnosis not present

## 2017-12-08 DIAGNOSIS — I10 Essential (primary) hypertension: Secondary | ICD-10-CM | POA: Diagnosis not present

## 2017-12-08 DIAGNOSIS — E1141 Type 2 diabetes mellitus with diabetic mononeuropathy: Secondary | ICD-10-CM | POA: Diagnosis not present

## 2017-12-08 DIAGNOSIS — E6609 Other obesity due to excess calories: Secondary | ICD-10-CM | POA: Diagnosis not present

## 2017-12-08 DIAGNOSIS — E039 Hypothyroidism, unspecified: Secondary | ICD-10-CM | POA: Diagnosis not present

## 2017-12-08 DIAGNOSIS — E785 Hyperlipidemia, unspecified: Secondary | ICD-10-CM | POA: Diagnosis not present

## 2017-12-09 DIAGNOSIS — E1141 Type 2 diabetes mellitus with diabetic mononeuropathy: Secondary | ICD-10-CM | POA: Diagnosis not present

## 2017-12-09 DIAGNOSIS — R270 Ataxia, unspecified: Secondary | ICD-10-CM | POA: Diagnosis not present

## 2017-12-09 DIAGNOSIS — Z6835 Body mass index (BMI) 35.0-35.9, adult: Secondary | ICD-10-CM | POA: Diagnosis not present

## 2017-12-09 DIAGNOSIS — E039 Hypothyroidism, unspecified: Secondary | ICD-10-CM | POA: Diagnosis not present

## 2017-12-16 DIAGNOSIS — Z23 Encounter for immunization: Secondary | ICD-10-CM | POA: Diagnosis not present

## 2018-01-21 DIAGNOSIS — Z0000 Encounter for general adult medical examination without abnormal findings: Secondary | ICD-10-CM | POA: Diagnosis not present

## 2018-03-11 DIAGNOSIS — I10 Essential (primary) hypertension: Secondary | ICD-10-CM | POA: Diagnosis not present

## 2018-03-11 DIAGNOSIS — F0151 Vascular dementia with behavioral disturbance: Secondary | ICD-10-CM | POA: Diagnosis not present

## 2018-03-11 DIAGNOSIS — E1141 Type 2 diabetes mellitus with diabetic mononeuropathy: Secondary | ICD-10-CM | POA: Diagnosis not present

## 2018-03-11 DIAGNOSIS — E039 Hypothyroidism, unspecified: Secondary | ICD-10-CM | POA: Diagnosis not present

## 2018-03-11 DIAGNOSIS — I2510 Atherosclerotic heart disease of native coronary artery without angina pectoris: Secondary | ICD-10-CM | POA: Diagnosis not present

## 2018-03-11 DIAGNOSIS — E785 Hyperlipidemia, unspecified: Secondary | ICD-10-CM | POA: Diagnosis not present

## 2018-03-11 DIAGNOSIS — Z6834 Body mass index (BMI) 34.0-34.9, adult: Secondary | ICD-10-CM | POA: Diagnosis not present

## 2018-04-22 DIAGNOSIS — I10 Essential (primary) hypertension: Secondary | ICD-10-CM | POA: Diagnosis not present

## 2018-04-22 DIAGNOSIS — E669 Obesity, unspecified: Secondary | ICD-10-CM | POA: Diagnosis not present

## 2018-04-22 DIAGNOSIS — E1169 Type 2 diabetes mellitus with other specified complication: Secondary | ICD-10-CM | POA: Diagnosis not present

## 2018-04-22 DIAGNOSIS — E039 Hypothyroidism, unspecified: Secondary | ICD-10-CM | POA: Diagnosis not present

## 2018-06-02 DIAGNOSIS — I10 Essential (primary) hypertension: Secondary | ICD-10-CM | POA: Diagnosis not present

## 2018-06-02 DIAGNOSIS — I252 Old myocardial infarction: Secondary | ICD-10-CM | POA: Diagnosis not present

## 2018-06-02 DIAGNOSIS — J449 Chronic obstructive pulmonary disease, unspecified: Secondary | ICD-10-CM | POA: Diagnosis not present

## 2018-06-02 DIAGNOSIS — E7800 Pure hypercholesterolemia, unspecified: Secondary | ICD-10-CM | POA: Diagnosis not present

## 2018-06-02 DIAGNOSIS — F0390 Unspecified dementia without behavioral disturbance: Secondary | ICD-10-CM | POA: Diagnosis not present

## 2018-06-02 DIAGNOSIS — T148XXA Other injury of unspecified body region, initial encounter: Secondary | ICD-10-CM | POA: Diagnosis not present

## 2018-06-02 DIAGNOSIS — S90812A Abrasion, left foot, initial encounter: Secondary | ICD-10-CM | POA: Diagnosis not present

## 2018-06-02 DIAGNOSIS — W19XXXA Unspecified fall, initial encounter: Secondary | ICD-10-CM | POA: Diagnosis not present

## 2018-06-02 DIAGNOSIS — M1990 Unspecified osteoarthritis, unspecified site: Secondary | ICD-10-CM | POA: Diagnosis not present

## 2018-06-02 DIAGNOSIS — R52 Pain, unspecified: Secondary | ICD-10-CM | POA: Diagnosis not present

## 2018-06-02 DIAGNOSIS — I4891 Unspecified atrial fibrillation: Secondary | ICD-10-CM | POA: Diagnosis not present

## 2018-06-02 DIAGNOSIS — E119 Type 2 diabetes mellitus without complications: Secondary | ICD-10-CM | POA: Diagnosis not present

## 2018-06-02 DIAGNOSIS — S90811A Abrasion, right foot, initial encounter: Secondary | ICD-10-CM | POA: Diagnosis not present

## 2018-06-02 DIAGNOSIS — S60511A Abrasion of right hand, initial encounter: Secondary | ICD-10-CM | POA: Diagnosis not present

## 2018-06-02 DIAGNOSIS — S60512A Abrasion of left hand, initial encounter: Secondary | ICD-10-CM | POA: Diagnosis not present

## 2018-06-02 DIAGNOSIS — R410 Disorientation, unspecified: Secondary | ICD-10-CM | POA: Diagnosis not present

## 2018-06-02 DIAGNOSIS — M5489 Other dorsalgia: Secondary | ICD-10-CM | POA: Diagnosis not present

## 2018-10-17 DIAGNOSIS — E1165 Type 2 diabetes mellitus with hyperglycemia: Secondary | ICD-10-CM | POA: Diagnosis not present

## 2018-10-17 DIAGNOSIS — N390 Urinary tract infection, site not specified: Secondary | ICD-10-CM | POA: Diagnosis not present

## 2018-10-17 DIAGNOSIS — R4182 Altered mental status, unspecified: Secondary | ICD-10-CM | POA: Diagnosis not present

## 2018-10-17 DIAGNOSIS — Z03818 Encounter for observation for suspected exposure to other biological agents ruled out: Secondary | ICD-10-CM | POA: Diagnosis not present

## 2018-10-17 DIAGNOSIS — R5381 Other malaise: Secondary | ICD-10-CM | POA: Diagnosis not present

## 2018-10-17 DIAGNOSIS — R4020 Unspecified coma: Secondary | ICD-10-CM | POA: Diagnosis not present

## 2018-10-17 DIAGNOSIS — R404 Transient alteration of awareness: Secondary | ICD-10-CM | POA: Diagnosis not present

## 2018-10-17 DIAGNOSIS — A419 Sepsis, unspecified organism: Secondary | ICD-10-CM | POA: Diagnosis not present

## 2018-10-17 DIAGNOSIS — B9689 Other specified bacterial agents as the cause of diseases classified elsewhere: Secondary | ICD-10-CM | POA: Diagnosis not present

## 2018-10-17 DIAGNOSIS — I491 Atrial premature depolarization: Secondary | ICD-10-CM | POA: Diagnosis not present

## 2018-10-18 DIAGNOSIS — I361 Nonrheumatic tricuspid (valve) insufficiency: Secondary | ICD-10-CM | POA: Diagnosis not present

## 2018-10-18 DIAGNOSIS — E872 Acidosis: Secondary | ICD-10-CM | POA: Diagnosis present

## 2018-10-18 DIAGNOSIS — R9431 Abnormal electrocardiogram [ECG] [EKG]: Secondary | ICD-10-CM | POA: Diagnosis present

## 2018-10-18 DIAGNOSIS — I69154 Hemiplegia and hemiparesis following nontraumatic intracerebral hemorrhage affecting left non-dominant side: Secondary | ICD-10-CM | POA: Diagnosis not present

## 2018-10-18 DIAGNOSIS — B9689 Other specified bacterial agents as the cause of diseases classified elsewhere: Secondary | ICD-10-CM | POA: Diagnosis not present

## 2018-10-18 DIAGNOSIS — J449 Chronic obstructive pulmonary disease, unspecified: Secondary | ICD-10-CM | POA: Diagnosis present

## 2018-10-18 DIAGNOSIS — I6523 Occlusion and stenosis of bilateral carotid arteries: Secondary | ICD-10-CM | POA: Diagnosis not present

## 2018-10-18 DIAGNOSIS — R569 Unspecified convulsions: Secondary | ICD-10-CM | POA: Diagnosis not present

## 2018-10-18 DIAGNOSIS — I69151 Hemiplegia and hemiparesis following nontraumatic intracerebral hemorrhage affecting right dominant side: Secondary | ICD-10-CM | POA: Diagnosis not present

## 2018-10-18 DIAGNOSIS — F17200 Nicotine dependence, unspecified, uncomplicated: Secondary | ICD-10-CM | POA: Diagnosis present

## 2018-10-18 DIAGNOSIS — I252 Old myocardial infarction: Secondary | ICD-10-CM | POA: Diagnosis not present

## 2018-10-18 DIAGNOSIS — I639 Cerebral infarction, unspecified: Secondary | ICD-10-CM | POA: Diagnosis not present

## 2018-10-18 DIAGNOSIS — Z79899 Other long term (current) drug therapy: Secondary | ICD-10-CM | POA: Diagnosis not present

## 2018-10-18 DIAGNOSIS — G9349 Other encephalopathy: Secondary | ICD-10-CM | POA: Diagnosis not present

## 2018-10-18 DIAGNOSIS — I4891 Unspecified atrial fibrillation: Secondary | ICD-10-CM | POA: Diagnosis not present

## 2018-10-18 DIAGNOSIS — I63412 Cerebral infarction due to embolism of left middle cerebral artery: Secondary | ICD-10-CM | POA: Diagnosis present

## 2018-10-18 DIAGNOSIS — Z23 Encounter for immunization: Secondary | ICD-10-CM | POA: Diagnosis not present

## 2018-10-18 DIAGNOSIS — I6521 Occlusion and stenosis of right carotid artery: Secondary | ICD-10-CM | POA: Diagnosis not present

## 2018-10-18 DIAGNOSIS — I248 Other forms of acute ischemic heart disease: Secondary | ICD-10-CM | POA: Diagnosis present

## 2018-10-18 DIAGNOSIS — R404 Transient alteration of awareness: Secondary | ICD-10-CM | POA: Diagnosis not present

## 2018-10-18 DIAGNOSIS — I340 Nonrheumatic mitral (valve) insufficiency: Secondary | ICD-10-CM | POA: Diagnosis not present

## 2018-10-18 DIAGNOSIS — G4700 Insomnia, unspecified: Secondary | ICD-10-CM | POA: Diagnosis not present

## 2018-10-18 DIAGNOSIS — I69365 Other paralytic syndrome following cerebral infarction, bilateral: Secondary | ICD-10-CM | POA: Diagnosis not present

## 2018-10-18 DIAGNOSIS — E785 Hyperlipidemia, unspecified: Secondary | ICD-10-CM | POA: Diagnosis present

## 2018-10-18 DIAGNOSIS — I480 Paroxysmal atrial fibrillation: Secondary | ICD-10-CM | POA: Diagnosis present

## 2018-10-18 DIAGNOSIS — Z7401 Bed confinement status: Secondary | ICD-10-CM | POA: Diagnosis not present

## 2018-10-18 DIAGNOSIS — Z03818 Encounter for observation for suspected exposure to other biological agents ruled out: Secondary | ICD-10-CM | POA: Diagnosis not present

## 2018-10-18 DIAGNOSIS — Z66 Do not resuscitate: Secondary | ICD-10-CM | POA: Diagnosis present

## 2018-10-18 DIAGNOSIS — E119 Type 2 diabetes mellitus without complications: Secondary | ICD-10-CM | POA: Diagnosis not present

## 2018-10-18 DIAGNOSIS — R5381 Other malaise: Secondary | ICD-10-CM | POA: Diagnosis not present

## 2018-10-18 DIAGNOSIS — I69320 Aphasia following cerebral infarction: Secondary | ICD-10-CM | POA: Diagnosis not present

## 2018-10-18 DIAGNOSIS — R4182 Altered mental status, unspecified: Secondary | ICD-10-CM | POA: Diagnosis not present

## 2018-10-18 DIAGNOSIS — N390 Urinary tract infection, site not specified: Secondary | ICD-10-CM | POA: Diagnosis not present

## 2018-10-18 DIAGNOSIS — R509 Fever, unspecified: Secondary | ICD-10-CM | POA: Diagnosis not present

## 2018-10-18 DIAGNOSIS — Z794 Long term (current) use of insulin: Secondary | ICD-10-CM | POA: Diagnosis not present

## 2018-10-18 DIAGNOSIS — R4701 Aphasia: Secondary | ICD-10-CM | POA: Diagnosis present

## 2018-10-18 DIAGNOSIS — R29713 NIHSS score 13: Secondary | ICD-10-CM | POA: Diagnosis present

## 2018-10-18 DIAGNOSIS — F329 Major depressive disorder, single episode, unspecified: Secondary | ICD-10-CM | POA: Diagnosis not present

## 2018-10-18 DIAGNOSIS — I10 Essential (primary) hypertension: Secondary | ICD-10-CM | POA: Diagnosis present

## 2018-10-18 DIAGNOSIS — E1165 Type 2 diabetes mellitus with hyperglycemia: Secondary | ICD-10-CM | POA: Diagnosis present

## 2018-10-18 DIAGNOSIS — M1990 Unspecified osteoarthritis, unspecified site: Secondary | ICD-10-CM | POA: Diagnosis present

## 2018-10-18 DIAGNOSIS — E039 Hypothyroidism, unspecified: Secondary | ICD-10-CM | POA: Diagnosis present

## 2018-10-18 DIAGNOSIS — A419 Sepsis, unspecified organism: Secondary | ICD-10-CM | POA: Diagnosis not present

## 2018-10-18 DIAGNOSIS — F0390 Unspecified dementia without behavioral disturbance: Secondary | ICD-10-CM | POA: Diagnosis present

## 2018-10-18 DIAGNOSIS — I63421 Cerebral infarction due to embolism of right anterior cerebral artery: Secondary | ICD-10-CM | POA: Diagnosis present

## 2018-10-18 DIAGNOSIS — G9341 Metabolic encephalopathy: Secondary | ICD-10-CM | POA: Diagnosis present

## 2018-10-18 DIAGNOSIS — N310 Uninhibited neuropathic bladder, not elsewhere classified: Secondary | ICD-10-CM | POA: Diagnosis present

## 2018-10-20 DIAGNOSIS — E119 Type 2 diabetes mellitus without complications: Secondary | ICD-10-CM | POA: Diagnosis not present

## 2018-10-20 DIAGNOSIS — N390 Urinary tract infection, site not specified: Secondary | ICD-10-CM | POA: Diagnosis not present

## 2018-10-23 DIAGNOSIS — E119 Type 2 diabetes mellitus without complications: Secondary | ICD-10-CM | POA: Diagnosis not present

## 2018-10-23 DIAGNOSIS — N390 Urinary tract infection, site not specified: Secondary | ICD-10-CM | POA: Diagnosis not present

## 2018-10-26 DIAGNOSIS — E785 Hyperlipidemia, unspecified: Secondary | ICD-10-CM | POA: Diagnosis not present

## 2018-10-26 DIAGNOSIS — I119 Hypertensive heart disease without heart failure: Secondary | ICD-10-CM | POA: Diagnosis not present

## 2018-10-26 DIAGNOSIS — F17200 Nicotine dependence, unspecified, uncomplicated: Secondary | ICD-10-CM | POA: Diagnosis not present

## 2018-10-26 DIAGNOSIS — I480 Paroxysmal atrial fibrillation: Secondary | ICD-10-CM | POA: Diagnosis not present

## 2018-10-26 DIAGNOSIS — G9349 Other encephalopathy: Secondary | ICD-10-CM | POA: Diagnosis not present

## 2018-10-26 DIAGNOSIS — I252 Old myocardial infarction: Secondary | ICD-10-CM | POA: Diagnosis not present

## 2018-10-26 DIAGNOSIS — I69151 Hemiplegia and hemiparesis following nontraumatic intracerebral hemorrhage affecting right dominant side: Secondary | ICD-10-CM | POA: Diagnosis not present

## 2018-10-26 DIAGNOSIS — E039 Hypothyroidism, unspecified: Secondary | ICD-10-CM | POA: Diagnosis not present

## 2018-10-26 DIAGNOSIS — I69320 Aphasia following cerebral infarction: Secondary | ICD-10-CM | POA: Diagnosis not present

## 2018-10-26 DIAGNOSIS — F329 Major depressive disorder, single episode, unspecified: Secondary | ICD-10-CM | POA: Diagnosis not present

## 2018-10-26 DIAGNOSIS — M1990 Unspecified osteoarthritis, unspecified site: Secondary | ICD-10-CM | POA: Diagnosis not present

## 2018-10-26 DIAGNOSIS — I248 Other forms of acute ischemic heart disease: Secondary | ICD-10-CM | POA: Diagnosis not present

## 2018-10-26 DIAGNOSIS — Z79899 Other long term (current) drug therapy: Secondary | ICD-10-CM | POA: Diagnosis not present

## 2018-10-26 DIAGNOSIS — R569 Unspecified convulsions: Secondary | ICD-10-CM | POA: Diagnosis not present

## 2018-10-26 DIAGNOSIS — R05 Cough: Secondary | ICD-10-CM | POA: Diagnosis not present

## 2018-10-26 DIAGNOSIS — Z66 Do not resuscitate: Secondary | ICD-10-CM | POA: Diagnosis not present

## 2018-10-26 DIAGNOSIS — Z7401 Bed confinement status: Secondary | ICD-10-CM | POA: Diagnosis not present

## 2018-10-26 DIAGNOSIS — R29713 NIHSS score 13: Secondary | ICD-10-CM | POA: Diagnosis not present

## 2018-10-26 DIAGNOSIS — E1165 Type 2 diabetes mellitus with hyperglycemia: Secondary | ICD-10-CM | POA: Diagnosis not present

## 2018-10-26 DIAGNOSIS — N390 Urinary tract infection, site not specified: Secondary | ICD-10-CM | POA: Diagnosis not present

## 2018-10-26 DIAGNOSIS — J449 Chronic obstructive pulmonary disease, unspecified: Secondary | ICD-10-CM | POA: Diagnosis not present

## 2018-10-26 DIAGNOSIS — G4700 Insomnia, unspecified: Secondary | ICD-10-CM | POA: Diagnosis not present

## 2018-10-26 DIAGNOSIS — Z794 Long term (current) use of insulin: Secondary | ICD-10-CM | POA: Diagnosis not present

## 2018-10-26 DIAGNOSIS — R9431 Abnormal electrocardiogram [ECG] [EKG]: Secondary | ICD-10-CM | POA: Diagnosis not present

## 2018-10-26 DIAGNOSIS — R404 Transient alteration of awareness: Secondary | ICD-10-CM | POA: Diagnosis not present

## 2018-10-26 DIAGNOSIS — R4701 Aphasia: Secondary | ICD-10-CM | POA: Diagnosis not present

## 2018-10-26 DIAGNOSIS — N310 Uninhibited neuropathic bladder, not elsewhere classified: Secondary | ICD-10-CM | POA: Diagnosis not present

## 2018-10-26 DIAGNOSIS — R4182 Altered mental status, unspecified: Secondary | ICD-10-CM | POA: Diagnosis not present

## 2018-10-26 DIAGNOSIS — I6359 Cerebral infarction due to unspecified occlusion or stenosis of other cerebral artery: Secondary | ICD-10-CM | POA: Diagnosis not present

## 2018-10-26 DIAGNOSIS — E119 Type 2 diabetes mellitus without complications: Secondary | ICD-10-CM | POA: Diagnosis not present

## 2018-10-26 DIAGNOSIS — G9341 Metabolic encephalopathy: Secondary | ICD-10-CM | POA: Diagnosis not present

## 2018-10-26 DIAGNOSIS — I69365 Other paralytic syndrome following cerebral infarction, bilateral: Secondary | ICD-10-CM | POA: Diagnosis not present

## 2018-10-26 DIAGNOSIS — Z23 Encounter for immunization: Secondary | ICD-10-CM | POA: Diagnosis not present

## 2018-10-26 DIAGNOSIS — I4891 Unspecified atrial fibrillation: Secondary | ICD-10-CM | POA: Diagnosis not present

## 2018-10-26 DIAGNOSIS — I6521 Occlusion and stenosis of right carotid artery: Secondary | ICD-10-CM | POA: Diagnosis not present

## 2018-10-26 DIAGNOSIS — E872 Acidosis: Secondary | ICD-10-CM | POA: Diagnosis not present

## 2018-10-26 DIAGNOSIS — I69154 Hemiplegia and hemiparesis following nontraumatic intracerebral hemorrhage affecting left non-dominant side: Secondary | ICD-10-CM | POA: Diagnosis not present

## 2018-10-26 DIAGNOSIS — J811 Chronic pulmonary edema: Secondary | ICD-10-CM | POA: Diagnosis not present

## 2018-10-26 DIAGNOSIS — I63412 Cerebral infarction due to embolism of left middle cerebral artery: Secondary | ICD-10-CM | POA: Diagnosis not present

## 2018-10-26 DIAGNOSIS — F0390 Unspecified dementia without behavioral disturbance: Secondary | ICD-10-CM | POA: Diagnosis not present

## 2018-10-26 DIAGNOSIS — I10 Essential (primary) hypertension: Secondary | ICD-10-CM | POA: Diagnosis not present

## 2018-10-28 DIAGNOSIS — R05 Cough: Secondary | ICD-10-CM | POA: Diagnosis not present

## 2018-11-03 DIAGNOSIS — I480 Paroxysmal atrial fibrillation: Secondary | ICD-10-CM | POA: Diagnosis not present

## 2018-11-03 DIAGNOSIS — I6359 Cerebral infarction due to unspecified occlusion or stenosis of other cerebral artery: Secondary | ICD-10-CM | POA: Diagnosis not present

## 2018-11-03 DIAGNOSIS — E039 Hypothyroidism, unspecified: Secondary | ICD-10-CM | POA: Diagnosis not present

## 2018-11-03 DIAGNOSIS — I119 Hypertensive heart disease without heart failure: Secondary | ICD-10-CM | POA: Diagnosis not present

## 2018-11-07 DIAGNOSIS — I6359 Cerebral infarction due to unspecified occlusion or stenosis of other cerebral artery: Secondary | ICD-10-CM | POA: Diagnosis not present

## 2018-11-07 DIAGNOSIS — R569 Unspecified convulsions: Secondary | ICD-10-CM | POA: Diagnosis not present

## 2018-11-07 DIAGNOSIS — I480 Paroxysmal atrial fibrillation: Secondary | ICD-10-CM | POA: Diagnosis not present

## 2018-11-07 DIAGNOSIS — E1165 Type 2 diabetes mellitus with hyperglycemia: Secondary | ICD-10-CM | POA: Diagnosis not present

## 2018-11-19 DEATH — deceased

## 2019-11-19 DEATH — deceased

## 2020-11-18 DEATH — deceased
